# Patient Record
Sex: Female | Born: 1955 | Race: White | Hispanic: No | Marital: Single | Smoking: Never smoker
Health system: Southern US, Community
[De-identification: ages and names within clinical notes are randomized; demographics above are authoritative.]

## PROBLEM LIST (undated history)

## (undated) DIAGNOSIS — E119 Type 2 diabetes mellitus without complications: Secondary | ICD-10-CM

## (undated) DIAGNOSIS — E079 Disorder of thyroid, unspecified: Secondary | ICD-10-CM

## (undated) HISTORY — PX: TOTAL VAGINAL HYSTERECTOMY: SHX2548

## (undated) HISTORY — PX: ABDOMINAL HYSTERECTOMY: SHX81

---

## 1981-06-29 HISTORY — PX: REDUCTION MAMMAPLASTY: SUR839

## 2005-11-10 ENCOUNTER — Ambulatory Visit: Payer: Self-pay | Admitting: General Surgery

## 2006-03-11 ENCOUNTER — Inpatient Hospital Stay: Payer: Self-pay | Admitting: Internal Medicine

## 2010-02-25 ENCOUNTER — Ambulatory Visit: Payer: Self-pay | Admitting: Nephrology

## 2013-01-04 ENCOUNTER — Ambulatory Visit: Payer: Self-pay | Admitting: Internal Medicine

## 2014-04-20 ENCOUNTER — Ambulatory Visit: Payer: Self-pay | Admitting: Specialist

## 2014-09-24 ENCOUNTER — Ambulatory Visit: Payer: Self-pay | Admitting: Gastroenterology

## 2014-09-25 ENCOUNTER — Ambulatory Visit: Payer: Self-pay | Admitting: Specialist

## 2014-10-22 LAB — SURGICAL PATHOLOGY

## 2015-01-09 ENCOUNTER — Other Ambulatory Visit: Payer: Self-pay | Admitting: Internal Medicine

## 2015-01-09 DIAGNOSIS — Z1231 Encounter for screening mammogram for malignant neoplasm of breast: Secondary | ICD-10-CM

## 2015-01-14 ENCOUNTER — Ambulatory Visit: Payer: Self-pay | Attending: Internal Medicine

## 2015-01-28 HISTORY — PX: LAPAROSCOPIC GASTRIC SLEEVE RESECTION: SHX5895

## 2015-09-24 ENCOUNTER — Ambulatory Visit
Admission: RE | Admit: 2015-09-24 | Discharge: 2015-09-24 | Disposition: A | Discharge status: Home / Self Care | Payer: BC Managed Care – PPO | Source: Ambulatory Visit | Attending: Internal Medicine | Admitting: Internal Medicine

## 2015-09-24 DIAGNOSIS — Z1231 Encounter for screening mammogram for malignant neoplasm of breast: Secondary | ICD-10-CM

## 2016-01-11 ENCOUNTER — Encounter: Payer: Self-pay | Admitting: Emergency Medicine

## 2016-01-11 ENCOUNTER — Emergency Department
Admission: EM | Admit: 2016-01-11 | Discharge: 2016-01-11 | Disposition: A | Discharge status: Home / Self Care | Payer: BC Managed Care – PPO | Attending: Emergency Medicine | Admitting: Emergency Medicine

## 2016-01-11 ENCOUNTER — Emergency Department: Payer: BC Managed Care – PPO

## 2016-01-11 DIAGNOSIS — E119 Type 2 diabetes mellitus without complications: Secondary | ICD-10-CM | POA: Insufficient documentation

## 2016-01-11 DIAGNOSIS — R1031 Right lower quadrant pain: Secondary | ICD-10-CM | POA: Diagnosis present

## 2016-01-11 DIAGNOSIS — N200 Calculus of kidney: Secondary | ICD-10-CM | POA: Insufficient documentation

## 2016-01-11 HISTORY — DX: Type 2 diabetes mellitus without complications: E11.9

## 2016-01-11 HISTORY — DX: Disorder of thyroid, unspecified: E07.9

## 2016-01-11 LAB — COMPREHENSIVE METABOLIC PANEL
ALBUMIN: 4.6 g/dL (ref 3.5–5.0)
ALK PHOS: 125 U/L (ref 38–126)
ALT: 25 U/L (ref 14–54)
AST: 27 U/L (ref 15–41)
Anion gap: 15 (ref 5–15)
BUN: 14 mg/dL (ref 6–20)
CALCIUM: 9.5 mg/dL (ref 8.9–10.3)
CHLORIDE: 99 mmol/L — AB (ref 101–111)
CO2: 20 mmol/L — AB (ref 22–32)
CREATININE: 0.76 mg/dL (ref 0.44–1.00)
GFR calc non Af Amer: >60 mL/min (ref >60)
GLUCOSE: 295 mg/dL — AB (ref 65–99)
Potassium: 4 mmol/L (ref 3.5–5.1)
SODIUM: 134 mmol/L — AB (ref 135–145)
Total Bilirubin: 1 mg/dL (ref 0.3–1.2)
Total Protein: 7.6 g/dL (ref 6.5–8.1)

## 2016-01-11 LAB — CBC
HCT: 44.9 % (ref 35.0–47.0)
Hemoglobin: 15.4 g/dL (ref 12.0–16.0)
MCH: 29.4 pg (ref 26.0–34.0)
MCHC: 34.3 g/dL (ref 32.0–36.0)
MCV: 85.7 fL (ref 80.0–100.0)
Platelets: 310 10*3/uL (ref 150–440)
RBC: 5.24 MIL/uL — AB (ref 3.80–5.20)
RDW: 13.4 % (ref 11.5–14.5)
WBC: 13 10*3/uL — ABNORMAL HIGH (ref 3.6–11.0)

## 2016-01-11 LAB — URINALYSIS COMPLETE WITH MICROSCOPIC (ARMC ONLY)
BILIRUBIN URINE: NEGATIVE
Glucose, UA: >500 mg/dL — AB
Nitrite: NEGATIVE
PH: 5 (ref 5.0–8.0)
Protein, ur: NEGATIVE mg/dL
Specific Gravity, Urine: 1.013 (ref 1.005–1.030)

## 2016-01-11 LAB — LIPASE, BLOOD: LIPASE: 32 U/L (ref 11–51)

## 2016-01-11 MED ORDER — ONDANSETRON 4 MG PO TBDP
4.0000 mg | ORAL_TABLET | Freq: Once | ORAL | Status: AC
  Administered 2016-01-11: 4 mg via ORAL
  Filled 2016-01-11: qty 1

## 2016-01-11 MED ORDER — MORPHINE SULFATE 15 MG PO TABS: 15.0000 mg | ORAL_TABLET | Freq: Four times a day (QID) | ORAL | Status: DC | PRN

## 2016-01-11 MED ORDER — ONDANSETRON 4 MG PO TBDP: 4.0000 mg | ORAL_TABLET | Freq: Four times a day (QID) | ORAL | Status: DC | PRN

## 2016-01-11 MED ORDER — IBUPROFEN 600 MG PO TABS
600.0000 mg | ORAL_TABLET | ORAL | Status: AC
  Administered 2016-01-11: 600 mg via ORAL
  Filled 2016-01-11: qty 1

## 2016-01-11 NOTE — Discharge Instructions (Signed)
You have been seen in the Emergency Department (ED) today for pain that we believe based on your workup, is caused by kidney stones.  As we have discussed, please drink plenty of fluids.  Please make a follow up appointment with the physician(s) listed elsewhere in this documentation.  You may take pain medication as needed but ONLY as prescribed.  Please also take your prescribed Flomax daily.  We also recommend that you take over-the-counter ibuprofen regularly according to label instructions over the next 5 days.  Take it with meals to minimize stomach discomfort.  Please see your doctor as soon as possible as stones may take 1-3 weeks to pass and you may require additional care or medications.  Do not drink alcohol, drive or participate in any other potentially dangerous activities while taking opiate pain medication (MORPHINE) as it may make you sleepy. Do not take this medication with any other sedating medications, either prescription or over-the-counter.    This medication is an opiate (or narcotic) pain medication and can be habit forming.  Use it as little as possible to achieve adequate pain control.  Do not use or use it with extreme caution if you have a history of opiate abuse or dependence.  If you are on a pain contract with your primary care doctor or a pain specialist, be sure to let them know you were prescribed this medication today from the [facility] Emergency Department.  This medication is intended for your use only - do not give any to anyone else and keep it in a secure place where nobody else, especially children, have access to it.  It will also cause or worsen constipation, so you may want to consider taking an over-the-counter stool softener while you are taking this medication.  Return to the Emergency Department (ED) or call your doctor if you have any worsening pain, fever, painful urination, are unable to urinate, or develop other symptoms that concern  you.   Kidney Stones Kidney stones (urolithiasis) are deposits that form inside your kidneys. The intense pain is caused by the stone moving through the urinary tract. When the stone moves, the ureter goes into spasm around the stone. The stone is usually passed in the urine.  CAUSES   A disorder that makes certain neck glands produce too much parathyroid hormone (primary hyperparathyroidism).  A buildup of uric acid crystals, similar to gout in your joints.  Narrowing (stricture) of the ureter.  A kidney obstruction present at birth (congenital obstruction).  Previous surgery on the kidney or ureters.  Numerous kidney infections. SYMPTOMS   Feeling sick to your stomach (nauseous).  Throwing up (vomiting).  Blood in the urine (hematuria).  Pain that usually spreads (radiates) to the groin.  Frequency or urgency of urination. DIAGNOSIS   Taking a history and physical exam.  Blood or urine tests.  CT scan.  Occasionally, an examination of the inside of the urinary bladder (cystoscopy) is performed. TREATMENT   Observation.  Increasing your fluid intake.  Extracorporeal shock wave lithotripsy--This is a noninvasive procedure that uses shock waves to break up kidney stones.  Surgery may be needed if you have severe pain or persistent obstruction. There are various surgical procedures. Most of the procedures are performed with the use of small instruments. Only small incisions are needed to accommodate these instruments, so recovery time is minimized. The size, location, and chemical composition are all important variables that will determine the proper choice of action for you. Talk to your  health care provider to better understand your situation so that you will minimize the risk of injury to yourself and your kidney.  HOME CARE INSTRUCTIONS   Drink enough water and fluids to keep your urine clear or pale yellow. This will help you to pass the stone or stone  fragments.  Strain all urine through the provided strainer. Keep all particulate matter and stones for your health care provider to see. The stone causing the pain may be as small as a grain of salt. It is very important to use the strainer each and every time you pass your urine. The collection of your stone will allow your health care provider to analyze it and verify that a stone has actually passed. The stone analysis will often identify what you can do to reduce the incidence of recurrences.  Only take over-the-counter or prescription medicines for pain, discomfort, or fever as directed by your health care provider.  Keep all follow-up visits as told by your health care provider. This is important.  Get follow-up X-rays if required. The absence of pain does not always mean that the stone has passed. It may have only stopped moving. If the urine remains completely obstructed, it can cause loss of kidney function or even complete destruction of the kidney. It is your responsibility to make sure X-rays and follow-ups are completed. Ultrasounds of the kidney can show blockages and the status of the kidney. Ultrasounds are not associated with any radiation and can be performed easily in a matter of minutes.  Make changes to your daily diet as told by your health care provider. You may be told to:  Limit the amount of salt that you eat.  Eat 5 or more servings of fruits and vegetables each day.  Limit the amount of meat, poultry, fish, and eggs that you eat.  Collect a 24-hour urine sample as told by your health care provider.You may need to collect another urine sample every 6-12 months. SEEK MEDICAL CARE IF:  You experience pain that is progressive and unresponsive to any pain medicine you have been prescribed. SEEK IMMEDIATE MEDICAL CARE IF:   Pain cannot be controlled with the prescribed medicine.  You have a fever or shaking chills.  The severity or intensity of pain increases over  18 hours and is not relieved by pain medicine.  You develop a new onset of abdominal pain.  You feel faint or pass out.  You are unable to urinate.   This information is not intended to replace advice given to you by your health care provider. Make sure you discuss any questions you have with your health care provider.   Document Released: 06/15/2005 Document Revised: 03/06/2015 Document Reviewed: 11/16/2012 Elsevier Interactive Patient Education [employer]2016 [employer] Inc.

## 2016-01-11 NOTE — ED Notes (Signed)
Patient refused IV at this time. Patient is sitting on side of stretcher for comfort.

## 2016-01-11 NOTE — ED Provider Notes (Signed)
[facility] Emergency Department Provider Note  ____________________________________________  Time seen: Approximately 6:04 PM  I have reviewed the triage vital signs and the nursing notes.   HISTORY  Chief Complaint Abdominal Pain    HPI Brittany Wood is a 60 y.o. female who reports she is having severe pain in her right lower abdomen and right flank.  Since about Wednesday she's been experiencing a discomfort with urination, and she also notes she is been having some slight amount of blood in her urine. Today she woke up and she had fairly severe pain described as 10 on a 10 now down to a 7 over the right flank and radiating towards the right lower back.  No vaginal discomfort or bleeding. She reports that she feels a little better now, but did throw up twice due to the severity of pain and still feels quite nauseated.  She is on ciprofloxacin which her primary care put her on because of a history of prior UTIs, but she denies any fevers chills abnormal urine odor and only experiencing a little bit of a strange feeling with urination for the last several days.   Past Medical History  Diagnosis Date  . Diabetes mellitus without complication (HCC)   . Thyroid disease     There are no active problems to display for this patient.   Past Surgical History  Procedure Laterality Date  . Reduction mammaplasty Bilateral 1983    scars around nipples and under breast from the surgery    Current Outpatient Rx  Name  Route  Sig  Dispense  Refill  . morphine (MSIR) 15 MG tablet   Oral   Take 1 tablet (15 mg total) by mouth every 6 (six) hours as needed for severe pain.   20 tablet   0   . ondansetron (ZOFRAN ODT) 4 MG disintegrating tablet   Oral   Take 1 tablet (4 mg total) by mouth every 6 (six) hours as needed for nausea or vomiting.   20 tablet   0     Allergies Review of patient's allergies indicates no known allergies.  Family History   Problem Relation Age of Onset  . Ovarian cancer Mother     Social History Social History  Substance Use Topics  . Smoking status: Never Smoker   . Smokeless tobacco: None  . Alcohol Use: No    Review of Systems Constitutional: No fever/chills Eyes: No visual changes. ENT: No sore throat. Cardiovascular: Denies chest pain. Respiratory: Denies shortness of breath. Gastrointestinal:   No diarrhea.  No constipation. Genitourinary: See history of present illness  Musculoskeletal: Negative for back pain. Skin: Negative for rash. Neurological: Negative for headaches, focal weakness or numbness.  10-point ROS otherwise negative.  ____________________________________________   PHYSICAL EXAM:  VITAL SIGNS: ED Triage Vitals  Enc Vitals Group     BP 01/11/16 1311 151/84 mmHg     Pulse Rate 01/11/16 1311 79     Resp 01/11/16 1311 20     Temp 01/11/16 1311 97.8 F (36.6 C)     Temp Source 01/11/16 1311 Oral     SpO2 01/11/16 1311 98 %     Weight 01/11/16 1311 210 lb (95.255 kg)     Height 01/11/16 1311 5\' 7"  (1.702 m)     Head Cir --      Peak Flow --      Pain Score 01/11/16 1312 7     Pain Loc --  Pain Edu? --      Excl. in GC? --    Constitutional: Alert and oriented. Well appearing and in no acute distressThough she is sitting up on the side of the bed, and appears to have discomfort along the right flank. Eyes: Conjunctivae are normal. PERRL. EOMI. Head: Atraumatic. Nose: No congestion/rhinnorhea. Mouth/Throat: Mucous membranes are moist.  Oropharynx non-erythematous. Neck: No stridor.   Cardiovascular: Normal rate, regular rhythm. Grossly normal heart sounds.  Good peripheral circulation. Respiratory: Normal respiratory effort.  No retractions. Lungs CTAB. Gastrointestinal: Soft and nontender. Right CVA tenderness noted. Negative Murphy No distention. No abdominal bruits.  Musculoskeletal: No lower extremity tenderness nor edema.  Neurologic:  Normal speech  and language. No gross focal neurologic deficits are appreciated. Skin:  Skin is warm, dry and intact. No rash noted. Psychiatric: Mood and affect are normal. Speech and behavior are normal.  ____________________________________________   LABS (all labs ordered are listed, but only abnormal results are displayed)  Labs Reviewed  COMPREHENSIVE METABOLIC PANEL - Abnormal; Notable for the following:    Sodium 134 (*)    Chloride 99 (*)    CO2 20 (*)    Glucose, Bld 295 (*)    All other components within normal limits  CBC - Abnormal; Notable for the following:    WBC 13.0 (*)    RBC 5.24 (*)    All other components within normal limits  URINALYSIS COMPLETEWITH MICROSCOPIC ([facility] ONLY) - Abnormal; Notable for the following:    Color, Urine YELLOW (*)    APPearance CLEAR (*)    Glucose, UA >500 (*)    Ketones, ur 1+ (*)    Hgb urine dipstick 3+ (*)    Leukocytes, UA TRACE (*)    Bacteria, UA RARE (*)    Squamous Epithelial / LPF 0-5 (*)    All other components within normal limits  LIPASE, BLOOD   ____________________________________________  EKG   ____________________________________________  RADIOLOGY   CT Renal Stone Study (Final result) Result time: 01/11/16 17:02:12   Final result by Rad Results In Interface (01/11/16 17:02:12)   Narrative:   CLINICAL DATA: 60 year old female with acute right flank and abdominal pain today with nausea and vomiting.  EXAM: CT ABDOMEN AND PELVIS WITHOUT CONTRAST  TECHNIQUE: Multidetector CT imaging of the abdomen and pelvis was performed following the standard protocol without IV contrast.  COMPARISON: 11/11/2015 CT and 09/25/2014 MRCP.  FINDINGS: Please note that parenchymal abnormalities may be missed without intravenous contrast.  Lower chest: No acute abnormality  Hepatobiliary: Hepatic steatosis identified without focal hepatic abnormality. Cholelithiasis identified without CT evidence of acute cholecystitis.  There is no evidence of biliary dilatation.  Pancreas: Unremarkable  Spleen: Unremarkable  Adrenals/Urinary Tract: A 2 mm right UVJ calculus causes mild right hydroureteronephrosis. A 2 mm scratch de punctate nonobstructing bilateral renal calculi are identified. The adrenal glands are unremarkable.  Stomach/Bowel: A small hiatal hernia is noted. There is no evidence of bowel obstruction.  Vascular/Lymphatic: Abdominal aortic atherosclerotic calcifications noted without aneurysm. No enlarged lymph nodes identified.  Reproductive: The patient is status post hysterectomy. No adnexal masses identified.  Other: No free fluid, focal collection or pneumoperitoneum.  Musculoskeletal: No acute or suspicious abnormality identified. Moderate degenerative disc disease at L5-S1 noted.  IMPRESSION: 2 mm right UVJ calculus causing mild right hydroureteronephrosis.  Punctate nonobstructing bilateral renal calculi.  Hepatic steatosis and cholelithiasis.  Small hiatal hernia.  Abdominal aortic atherosclerosis.   Electronically Signed By: Harmon Pier M.D. On: 01/11/2016 17:02  ____________________________________________   PROCEDURES  Procedure(s) performed: None  Critical Care performed: No  ____________________________________________   INITIAL IMPRESSION / ASSESSMENT AND PLAN / ED COURSE  Pertinent labs & imaging results that were available during my care of the patient were reviewed by me and considered in my medical decision making (see chart for details).  Appears consistent with right-sided kidney stone. Afebrile, slight leukocytosis but suspect likely or inflammatory or due to pain. No evidence of infection, currently on Cipro.  Patient reports she tolerates morphine orally after previous surgery, but she cannot tolerate any Percocet or Vicodin due to severe nausea. I will give her a prescription for morphine for pain control, patient very agreeable to  safe use instructions which I personally discussed with her. I will prescribe the patient a narcotic pain medicine due to their condition which I anticipate will cause at least moderate pain short term. I discussed with the patient safe use of narcotic pain medicines, and that they are not to drive, work in dangerous areas, or ever take more than prescribed (no more than 1 pill every 6 hours). We discussed that this is the type of medication that can be  overdosed on and the risks of this type of medicine. Patient is very agreeable to only use as prescribed and to never use more than prescribed.  Return precautions and treatment recommendations and follow-up discussed with the patient who is agreeable with the plan. Patient's dad is picking her up.  ____________________________________________   FINAL CLINICAL IMPRESSION(S) / ED DIAGNOSES  Final diagnoses:  Right kidney stone      Sharyn Creamer, MD 01/11/16 1825

## 2016-01-11 NOTE — ED Notes (Signed)
Presents with Right sided abd pain/flank pain since about 6 am   Positive nv/

## 2016-02-11 IMAGING — MR MR ABDOMEN WO/W CM MRCP
19 of 22 series · 41 of 48 positions shown · IV contrast (20ML MULTIHANCE)
Comparison: Ultrasound on 04/20/2014

CLINICAL DATA: Cholelithiasis. Elevated alkaline phosphatase level.
Generalized abdominal pain.

EXAM:
MRI ABDOMEN WITHOUT AND WITH CONTRAST (INCLUDING MRCP)
TECHNIQUE: Multiplanar multisequence MR imaging of the abdomen was performed
both before and after the administration of intravenous contrast.
Heavily T2-weighted images of the biliary and pancreatic ducts were
obtained, and three-dimensional MRCP images were rendered by post
processing.
CONTRAST:  20 mL MultiHance

[Series 4: bSSFP · coronal · 6.0mm · 0.88mm/px · 1 of 37 slices shown]
[im 1/37]
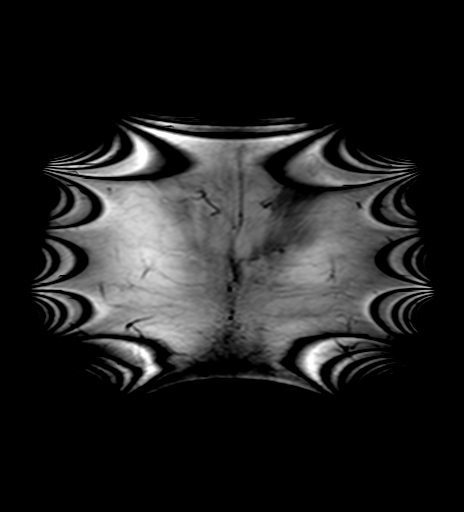

[Series 5: T2 fat-sat · axial · 6.0mm · 1.76mm/px · 1 of 39 slices shown]
[im 1/39]
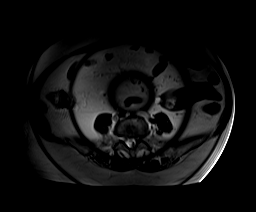

[Series 7: DWI · axial · 6.0mm · 2.34mm/px · z∈[-133,+141]mm · 3 of 117 slices shown]
[im 1/117]
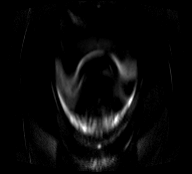
[im 59/117]
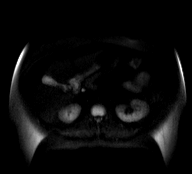
[im 117/117]
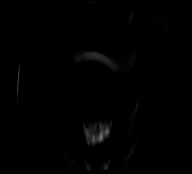

[Series 8: ax dwi_adc · axial · 6.0mm · 2.34mm/px · 1 of 39 slices shown]
[im 1/39]
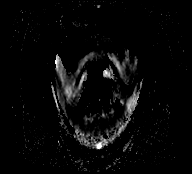

[Series 10: cor thins · coronal · 3.0mm · 1.17mm/px · 1 of 17 slices shown]
[im 1/17]
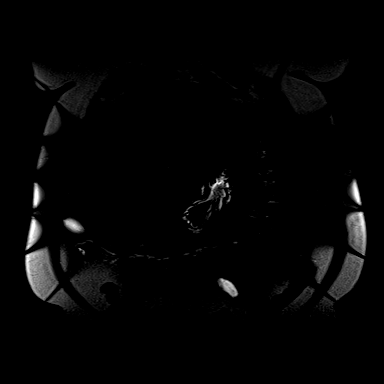

[Series 11: MRCP · coronal · 5.5mm · 1.17mm/px · 1 of 17 slices shown]
[im 1/17]
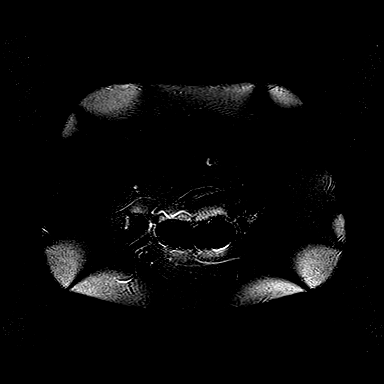

[Series 13: T2 · coronal · 2.0mm · 1.17mm/px · 3 of 80 slices shown]
[im 1/80]
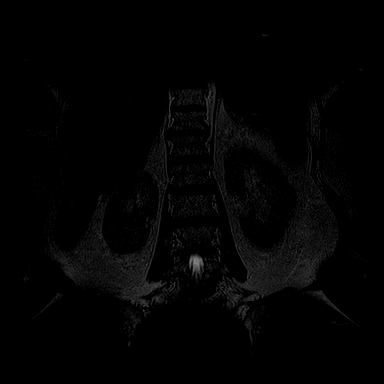
[im 40/80]
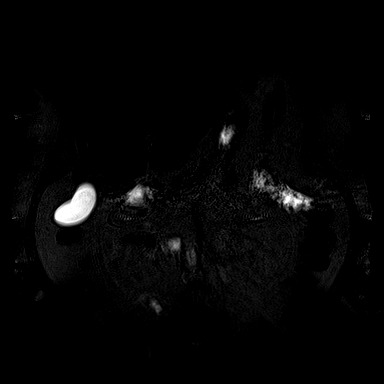
[im 80/80]
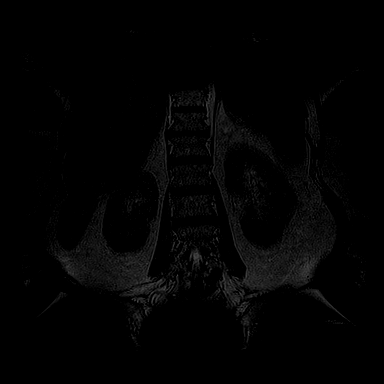

[Series 15: axial ssfse / · axial · 6.0mm · 1.41mm/px · 1 of 39 slices shown]
[im 1/39]
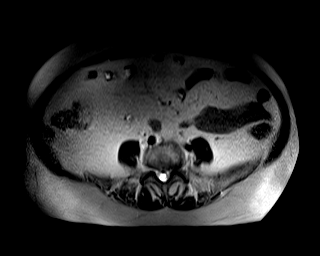

[Series 16: axial dynamic pre · axial · non-contrast · 4.0mm · 1.41mm/px · z∈[-138,+146]mm · 3 of 72 slices shown]
[im 1/72]
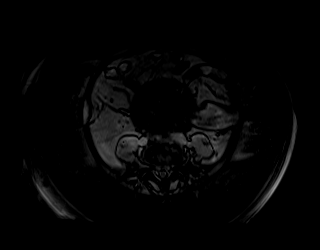
[im 36/72]
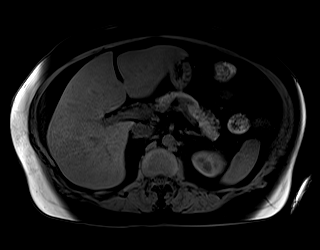
[im 72/72]
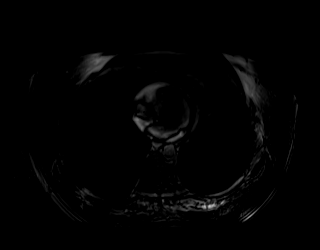

[Series 17: axial dynamic post · axial · 4.0mm · 1.41mm/px · z∈[-138,+146]mm · 3 of 72 slices shown (1 of 3)]
[im 1/72]
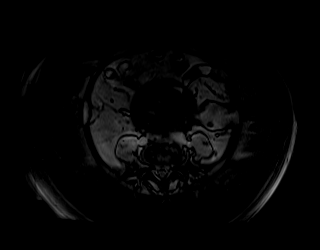
[im 36/72]
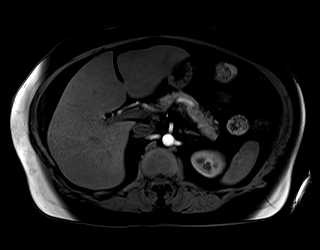
[im 72/72]
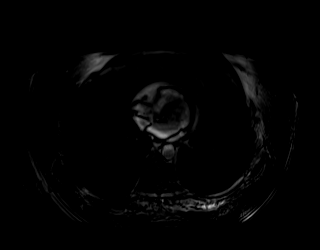

[Series 18: axial dynamic post · axial · 4.0mm · 1.41mm/px · z∈[-138,+146]mm · 3 of 72 slices shown (2 of 3)]
[im 1/72]
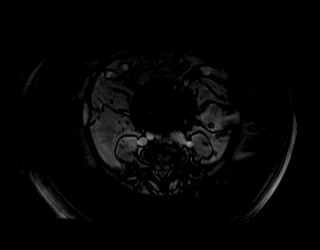
[im 36/72]
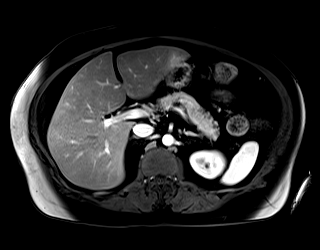
[im 72/72]
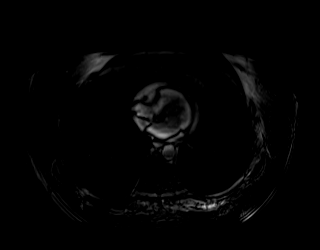

[Series 19: axial dynamic post · axial · 4.0mm · 1.41mm/px · z∈[-138,+146]mm · 3 of 72 slices shown (3 of 3)]
[im 1/72]
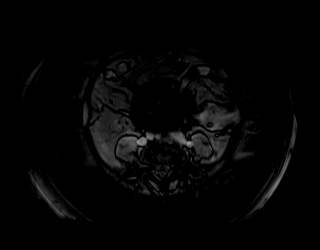
[im 36/72]
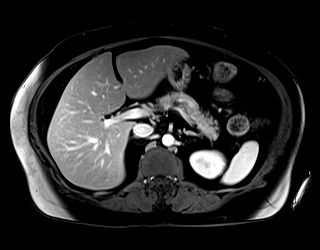
[im 72/72]
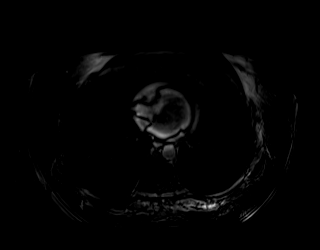

[Series 21: axial dynamic delayed · axial · 4.0mm · 1.41mm/px · z∈[-138,+146]mm · 3 of 72 slices shown]
[im 1/72]
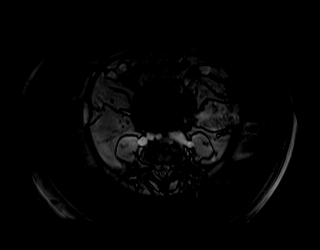
[im 36/72]
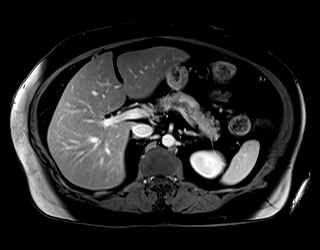
[im 72/72]
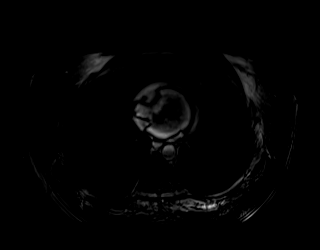

[Series 100: axial out of · axial · 6.0mm · 0.88mm/px · 1 of 39 slices shown]
[im 1/39]
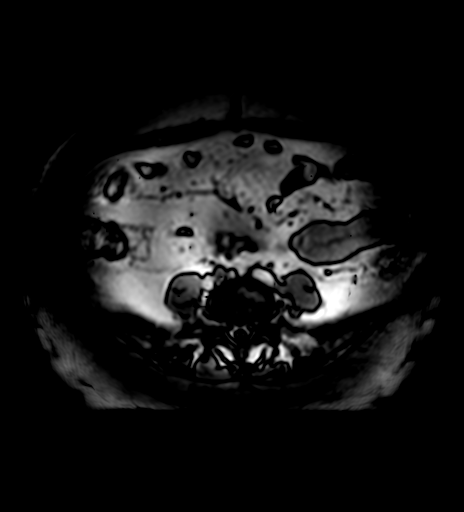

[Series 101: axial in phase · axial · 6.0mm · 0.88mm/px · 1 of 39 slices shown]
[im 1/39]
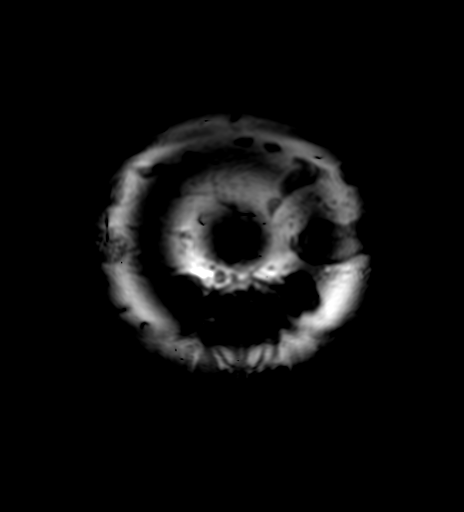

[Series 104: axial sub immediate · axial · 4.0mm · 1.41mm/px · z∈[-138,+146]mm · 3 of 72 slices shown]
[im 1/72]
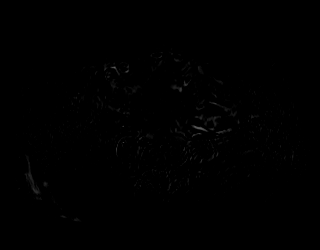
[im 36/72]
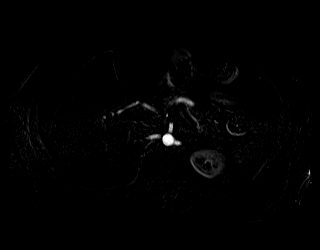
[im 72/72]
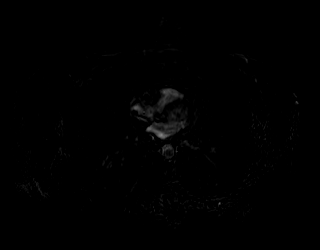

[Series 105: axial sub 45 · axial · 4.0mm · 1.41mm/px · z∈[-138,+146]mm · 3 of 72 slices shown]
[im 1/72]
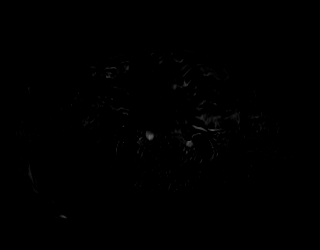
[im 36/72]
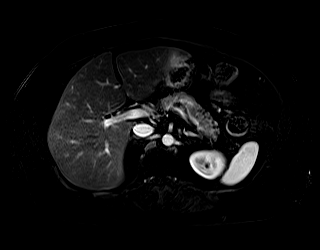
[im 72/72]
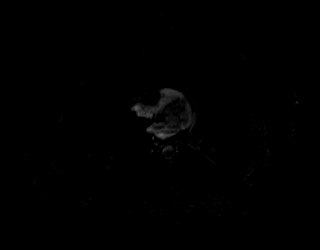

[Series 106: axial sub 90 · axial · 4.0mm · 1.41mm/px · z∈[-138,+146]mm · 3 of 72 slices shown]
[im 1/72]
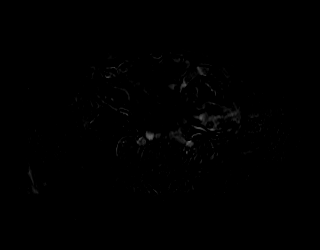
[im 36/72]
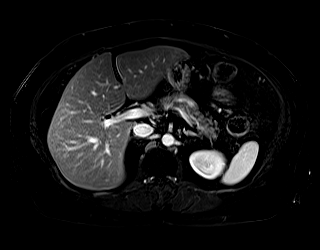
[im 72/72]
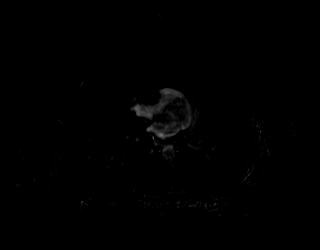

[Series 107: axial sub delayed · axial · 4.0mm · 1.41mm/px · z∈[-138,+146]mm · 3 of 72 slices shown]
[im 1/72]
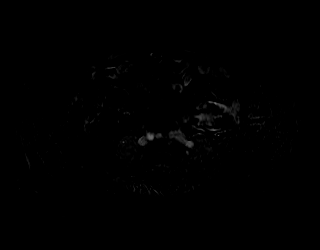
[im 36/72]
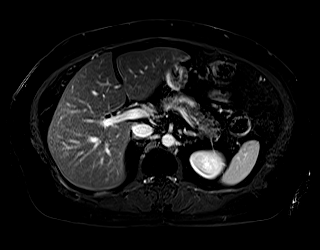
[im 72/72]
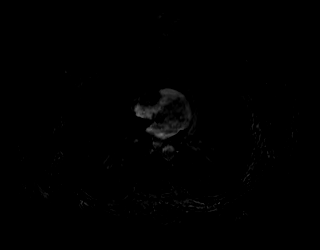

[41 of 48 positions shown; findings below may reference images not displayed]

FINDINGS: Lower chest:  Unremarkable.

Hepatobiliary: No hypervascular or hypovascular masses identified.
Moderate to severe hepatic steatosis is demonstrated on chemical
shift imaging.

Cholelithiasis is demonstrated, without evidence of acute
cholecystitis. No evidence of biliary ductal dilatation with common
bile duct measuring 4 mm. No definite calculi seen within the common
bile duct.

Pancreas: No masses, inflammatory changes, or fluid collections
demonstrated.

Spleen:  Within normal limits in size and appearance.

Adrenal Glands:  No masses identified.

Kidneys: No renal masses identified. No evidence of hydronephrosis.

Stomach/Bowel/Peritoneum: No evidence of wall thickening, mass, or
obstruction involving visualized abdominal bowel.

Vascular/Lymphatic: No pathologically enlarged lymph nodes
identified. No other significant abnormality noted.

Other:  None.

Musculoskeletal:  No suspicious bone lesions identified.
IMPRESSION: Cholelithiasis. No radiographic evidence of cholecystitis, biliary
dilatation, or choledocholithiasis.

Moderate to severe hepatic steatosis.

## 2016-09-15 ENCOUNTER — Other Ambulatory Visit: Payer: Self-pay | Admitting: Internal Medicine

## 2016-09-15 DIAGNOSIS — Z1231 Encounter for screening mammogram for malignant neoplasm of breast: Secondary | ICD-10-CM

## 2016-09-29 ENCOUNTER — Ambulatory Visit
Admission: RE | Admit: 2016-09-29 | Discharge: 2016-09-29 | Disposition: A | Discharge status: Home / Self Care | Payer: BC Managed Care – PPO | Source: Ambulatory Visit | Attending: Internal Medicine | Admitting: Internal Medicine

## 2016-09-29 DIAGNOSIS — Z1231 Encounter for screening mammogram for malignant neoplasm of breast: Secondary | ICD-10-CM | POA: Diagnosis present

## 2016-12-19 ENCOUNTER — Emergency Department
Admission: EM | Admit: 2016-12-19 | Discharge: 2016-12-19 | Disposition: A | Discharge status: Home / Self Care | Payer: BC Managed Care – PPO | Attending: Emergency Medicine | Admitting: Emergency Medicine

## 2016-12-19 ENCOUNTER — Encounter: Payer: Self-pay | Admitting: Emergency Medicine

## 2016-12-19 ENCOUNTER — Emergency Department: Payer: BC Managed Care – PPO

## 2016-12-19 DIAGNOSIS — R1032 Left lower quadrant pain: Secondary | ICD-10-CM | POA: Insufficient documentation

## 2016-12-19 DIAGNOSIS — Z79899 Other long term (current) drug therapy: Secondary | ICD-10-CM | POA: Insufficient documentation

## 2016-12-19 DIAGNOSIS — E119 Type 2 diabetes mellitus without complications: Secondary | ICD-10-CM | POA: Insufficient documentation

## 2016-12-19 DIAGNOSIS — K5900 Constipation, unspecified: Secondary | ICD-10-CM | POA: Diagnosis not present

## 2016-12-19 DIAGNOSIS — R109 Unspecified abdominal pain: Secondary | ICD-10-CM

## 2016-12-19 LAB — BASIC METABOLIC PANEL
ANION GAP: 8 (ref 5–15)
BUN: 13 mg/dL (ref 6–20)
CHLORIDE: 99 mmol/L — AB (ref 101–111)
CO2: 28 mmol/L (ref 22–32)
Calcium: 9.1 mg/dL (ref 8.9–10.3)
Creatinine, Ser: 0.44 mg/dL (ref 0.44–1.00)
GFR calc Af Amer: >60 mL/min (ref >60)
GLUCOSE: 213 mg/dL — AB (ref 65–99)
POTASSIUM: 3.7 mmol/L (ref 3.5–5.1)
Sodium: 135 mmol/L (ref 135–145)

## 2016-12-19 LAB — URINALYSIS, COMPLETE (UACMP) WITH MICROSCOPIC
Bacteria, UA: NONE SEEN
Bilirubin Urine: NEGATIVE
Glucose, UA: >=500 mg/dL — AB
Ketones, ur: NEGATIVE mg/dL
Leukocytes, UA: NEGATIVE
Nitrite: NEGATIVE
PH: 5 (ref 5.0–8.0)
Protein, ur: NEGATIVE mg/dL
SPECIFIC GRAVITY, URINE: 1.031 — AB (ref 1.005–1.030)

## 2016-12-19 LAB — CBC
HEMATOCRIT: 39.1 % (ref 35.0–47.0)
HEMOGLOBIN: 13.4 g/dL (ref 12.0–16.0)
MCH: 28.8 pg (ref 26.0–34.0)
MCHC: 34.4 g/dL (ref 32.0–36.0)
MCV: 83.8 fL (ref 80.0–100.0)
PLATELETS: 280 10*3/uL (ref 150–440)
RBC: 4.66 MIL/uL (ref 3.80–5.20)
RDW: 13.3 % (ref 11.5–14.5)
WBC: 9.5 10*3/uL (ref 3.6–11.0)

## 2016-12-19 MED ORDER — MORPHINE SULFATE (PF) 4 MG/ML IV SOLN
4.0000 mg | Freq: Once | INTRAVENOUS | Status: AC
Start: 2016-12-19 — End: 2016-12-19
  Administered 2016-12-19: 4 mg via INTRAVENOUS
  Filled 2016-12-19: qty 1

## 2016-12-19 MED ORDER — ONDANSETRON HCL 4 MG/2ML IJ SOLN
4.0000 mg | Freq: Once | INTRAMUSCULAR | Status: AC
Start: 2016-12-19 — End: 2016-12-19
  Administered 2016-12-19: 4 mg via INTRAVENOUS
  Filled 2016-12-19: qty 2

## 2016-12-19 MED ORDER — IOPAMIDOL (ISOVUE-370) INJECTION 76%
100.0000 mL | Freq: Once | INTRAVENOUS | Status: AC | PRN
  Administered 2016-12-19: 100 mL via INTRAVENOUS

## 2016-12-19 NOTE — Discharge Instructions (Addendum)
Constipation: Take colace twice a day everyday. Take senna once a day at bedtime. Take daily probiotics. Drink plenty of fluids and eat a diet rich in fiber. If you go more than 3 days without a bowel movement, take 1 cap full of Miralax in the morning and one in the evening up to 5 days.  ° °

## 2016-12-19 NOTE — ED Provider Notes (Signed)
[facility] Emergency Department Provider Note  ____________________________________________  Time seen: Approximately 5:21 PM  I have reviewed the triage vital signs and the nursing notes.   HISTORY  Chief Complaint Flank Pain   HPI Brittany Wood is a 61 y.o. female with a history of diabetes, hypothyroidism, kidney stones and presents for evaluation of left flank pain. Patient endorses 2-3 daysof sharp, intermittent, severe at times left flank pain radiating to the left lower quadrant. She has had nausea but no vomiting. She has had hematuria but no dysuria. She also been constipated for the same number of days which is atypical for her. Yesterday she noted her abdomen was a little distended on the left side however that has resolved. She is passing flatus. No prior history of SBO. She has had a sleeve gastrectomy and hysterectomy in the past. She denies diarrhea.  Today she took Colace and was straining in the bathroom unsuccessfully however had blood in the toilet. No prior history of rectal bleeding. She is not on any blood thinners. Currently her pain is 6 out of 10.  Past Medical History:  Diagnosis Date  . Diabetes mellitus without complication (HCC)   . Thyroid disease     There are no active problems to display for this patient.   Past Surgical History:  Procedure Laterality Date  . ABDOMINAL HYSTERECTOMY    . REDUCTION MAMMAPLASTY Bilateral 1983   scars around nipples and under breast from the surgery    Prior to Admission medications   Medication Sig Start Date End Date Taking? Authorizing Provider  cholecalciferol (VITAMIN D) 1000 units tablet Take 1,000 Units by mouth daily.   Yes [provider]  escitalopram (LEXAPRO) 20 MG tablet Take 20 mg by mouth daily. 12/09/16  Yes [provider]  levothyroxine (SYNTHROID, LEVOTHROID) 200 MCG tablet Take 200 mcg by mouth daily. 12/06/16  Yes [provider]    LORazepam (ATIVAN) 0.5 MG tablet Take 0.5 mg by mouth 3 (three) times daily as needed. 12/09/16  Yes [provider]  morphine (MSIR) 15 MG tablet Take 1 tablet (15 mg total) by mouth every 6 (six) hours as needed for severe pain. Patient not taking: Reported on 12/19/2016 01/11/16   Sharyn CreamerQuale, Mark, MD  ondansetron (ZOFRAN ODT) 4 MG disintegrating tablet Take 1 tablet (4 mg total) by mouth every 6 (six) hours as needed for nausea or vomiting. Patient not taking: Reported on 12/19/2016 01/11/16   Sharyn CreamerQuale, Mark, MD    Allergies Codeine  Family History  Problem Relation Age of Onset  . Ovarian cancer Mother   . Breast cancer Maternal Aunt        mat great aunt    Social History Social History  Substance Use Topics  . Smoking status: Never Smoker  . Smokeless tobacco: Never Used  . Alcohol use Yes    Review of Systems  Constitutional: Negative for fever. Eyes: Negative for visual changes. ENT: Negative for sore throat. Neck: No neck pain  Cardiovascular: Negative for chest pain. Respiratory: Negative for shortness of breath. Gastrointestinal: Negative for abdominal pain, vomiting, and diarrhea. + N, constipation and rectal bleeding Genitourinary: Negative for dysuria. + hematuria and L flank pain Musculoskeletal: Negative for back pain. Skin: Negative for rash. Neurological: Negative for headaches, weakness or numbness. Psych: No SI or HI  ____________________________________________   PHYSICAL EXAM:  VITAL SIGNS: ED Triage Vitals [12/19/16 1702]  Enc Vitals Group     BP (!) 123/56  Pulse Rate (!) 104     Resp 18     Temp 99 F (37.2 C)     Temp Source Oral     SpO2 98 %     Weight 198 lb (89.8 kg)     Height 5\' 9"  (1.753 m)     Head Circumference      Peak Flow      Pain Score 5     Pain Loc      Pain Edu?      Excl. in GC?     Constitutional: Alert and oriented. Well appearing and in no apparent distress. HEENT:      Head: Normocephalic and  atraumatic.         Eyes: Conjunctivae are normal. Sclera is non-icteric.       Mouth/Throat: Mucous membranes are moist.       Neck: Supple with no signs of meningismus. Cardiovascular: Tachycardic with regular rhythm. No murmurs, gallops, or rubs. 2+ symmetrical distal pulses are present in all extremities. No JVD. Respiratory: Normal respiratory effort. Lungs are clear to auscultation bilaterally. No wheezes, crackles, or rhonchi.  Gastrointestinal: Soft, mild left sided tpp, and non distended with positive bowel sounds. No rebound or guarding. Genitourinary: Mild L CVA tenderness. Rectal exam showing a anal fissure with signs of prior bleeding but hemostatic during exam. Musculoskeletal: Nontender with normal range of motion in all extremities. No edema, cyanosis, or erythema of extremities. Neurologic: Normal speech and language. Face is symmetric. Moving all extremities. No gross focal neurologic deficits are appreciated. Skin: Skin is warm, dry and intact. No rash noted. Psychiatric: Mood and affect are normal. Speech and behavior are normal.  ____________________________________________   LABS (all labs ordered are listed, but only abnormal results are displayed)  Labs Reviewed  URINALYSIS, COMPLETE (UACMP) WITH MICROSCOPIC - Abnormal; Notable for the following:       Result Value   Color, Urine YELLOW (*)    APPearance CLEAR (*)    Specific Gravity, Urine 1.031 (*)    Glucose, UA >=500 (*)    Hgb urine dipstick SMALL (*)    Squamous Epithelial / LPF 0-5 (*)    All other components within normal limits  BASIC METABOLIC PANEL - Abnormal; Notable for the following:    Chloride 99 (*)    Glucose, Bld 213 (*)    All other components within normal limits  URINE CULTURE  CBC   ____________________________________________  EKG  none ____________________________________________  RADIOLOGY  CTA c/a/p: 1. No acute findings within the chest, abdomen or pelvis. No renal or  ureteral stone. 2. Cholelithiasis without evidence of acute cholecystitis. 3. Moderate amount of stool throughout the nondistended colon and rectal vault (constipation? ). No bowel obstruction or evidence of bowel wall inflammation. 4. Small hiatal hernia. 5. Aortic atherosclerosis. ____________________________________________   PROCEDURES  Procedure(s) performed: None Procedures Critical Care performed:  None ____________________________________________   INITIAL IMPRESSION / ASSESSMENT AND PLAN / ED COURSE  61 y.o. female with a history of diabetes, hypothyroidism, kidney stones and presents for evaluation of left flank pain x 3 days, constipation, hematuria, one episode of rectal bleeding.  #flank pain and hematuria: Patient with history of kidney stones in the past. We'll sent for CT renal to rule out a stone. We'll also check urinalysis to rule out superimposed urinary tract infection.  #rectal bleeding: on exam patient with anal fissure which is now hemostatic.  Clinical Course as of Dec 20 2027  Sat Dec 19, 2016  1937 CT abdomen and pelvis without an acute finding, UA negative for UTI. Patient's pain intensified in the emergency room and she looked pretty uncomfortable. CTA will be done to rule out PE versus dissection.  [CV]  2027 CTA with no evidence of PE, dissection, or any other acute findings. Patient does have large amount of stool burden which could be causing her symptoms especially since she's been constipated for the last 3 days. We'll give an enema and reassess. Care transferred to Dr. Scotty Court  [CV]    Clinical Course User Index [CV] Don Perking [place], MD    Pertinent labs & imaging results that were available during my care of the patient were reviewed by me and considered in my medical decision making (see chart for details).    ____________________________________________   FINAL CLINICAL IMPRESSION(S) / ED DIAGNOSES  Final diagnoses:  Left flank  pain  Constipation, unspecified constipation type      NEW MEDICATIONS STARTED DURING THIS VISIT:  New Prescriptions   No medications on file     Note:  This document was prepared using Dragon voice recognition software and may include unintentional dictation errors.    Nita Sickle, MD 12/19/16 2029

## 2016-12-19 NOTE — ED Notes (Signed)
Patient transported to CT 

## 2016-12-19 NOTE — ED Triage Notes (Signed)
Pt arrived via POV from home states she has been having left flank pain since Friday, states pain is similar to kidney stone she had last year. Decreased urine output with some discomfort.  Pain radiates to left side of abdomen down to lower abdomen.  Pt also c/o constipation.

## 2016-12-19 NOTE — ED Provider Notes (Signed)
 -----------------------------------------   10:19 PM on 12/19/2016 -----------------------------------------  Assumed care from Dr. Don Perkingveronese. Workup unremarkable. Patient did have significant constipation and received an enema. Had a large bowel movement and is feeling much better. Vital signs stable. We'll discharge home to continue MiraLAX for her constipation.   Sharman CheekStafford, Brittany Hilbun, MD 12/19/16 2220

## 2016-12-19 NOTE — ED Notes (Signed)
Pt reports large BM, states pain slightly better

## 2016-12-21 LAB — URINE CULTURE

## 2017-03-27 ENCOUNTER — Emergency Department: Payer: BC Managed Care – PPO

## 2017-03-27 ENCOUNTER — Encounter: Payer: Self-pay | Admitting: Emergency Medicine

## 2017-03-27 ENCOUNTER — Emergency Department
Admission: EM | Admit: 2017-03-27 | Discharge: 2017-03-27 | Disposition: A | Discharge status: Home / Self Care | Payer: BC Managed Care – PPO | Attending: Emergency Medicine | Admitting: Emergency Medicine

## 2017-03-27 DIAGNOSIS — W010XXA Fall on same level from slipping, tripping and stumbling without subsequent striking against object, initial encounter: Secondary | ICD-10-CM | POA: Diagnosis not present

## 2017-03-27 DIAGNOSIS — Y92009 Unspecified place in unspecified non-institutional (private) residence as the place of occurrence of the external cause: Secondary | ICD-10-CM | POA: Diagnosis not present

## 2017-03-27 DIAGNOSIS — E119 Type 2 diabetes mellitus without complications: Secondary | ICD-10-CM | POA: Diagnosis not present

## 2017-03-27 DIAGNOSIS — Y939 Activity, unspecified: Secondary | ICD-10-CM | POA: Insufficient documentation

## 2017-03-27 DIAGNOSIS — Z79899 Other long term (current) drug therapy: Secondary | ICD-10-CM | POA: Insufficient documentation

## 2017-03-27 DIAGNOSIS — Y999 Unspecified external cause status: Secondary | ICD-10-CM | POA: Insufficient documentation

## 2017-03-27 DIAGNOSIS — S82821A Torus fracture of lower end of right fibula, initial encounter for closed fracture: Secondary | ICD-10-CM

## 2017-03-27 DIAGNOSIS — S8991XA Unspecified injury of right lower leg, initial encounter: Secondary | ICD-10-CM | POA: Diagnosis present

## 2017-03-27 MED ORDER — TRAMADOL HCL 50 MG PO TABS
50.0000 mg | ORAL_TABLET | Freq: Once | ORAL | Status: AC
  Administered 2017-03-27: 50 mg via ORAL

## 2017-03-27 MED ORDER — ONDANSETRON 4 MG PO TBDP: 4.0000 mg | ORAL_TABLET | Freq: Three times a day (TID) | ORAL | 0 refills | Status: DC | PRN

## 2017-03-27 MED ORDER — TRAMADOL HCL 50 MG PO TABS
ORAL_TABLET | ORAL | Status: AC
  Filled 2017-03-27: qty 1

## 2017-03-27 MED ORDER — TRAMADOL HCL 50 MG PO TABS: 50.0000 mg | ORAL_TABLET | Freq: Four times a day (QID) | ORAL | 0 refills | Status: DC | PRN

## 2017-03-27 MED ORDER — ONDANSETRON 4 MG PO TBDP
4.0000 mg | ORAL_TABLET | Freq: Once | ORAL | Status: AC
  Administered 2017-03-27: 4 mg via ORAL

## 2017-03-27 MED ORDER — ONDANSETRON 4 MG PO TBDP
ORAL_TABLET | ORAL | Status: AC
  Filled 2017-03-27: qty 1

## 2017-03-27 NOTE — ED Notes (Signed)
See triage note  States she slipped this am on ice  Twisted right ankle  Was sent in from [place] urgent care for possible fx and treatment

## 2017-03-27 NOTE — ED Triage Notes (Signed)
Slipped and fell 3am, pain R foot. Went to [facility], has xray report with her.

## 2017-03-27 NOTE — ED Provider Notes (Signed)
[facility] Emergency Department Provider Note   ____________________________________________   First MD Initiated Contact with Patient 03/27/17 1326     (approximate)  I have reviewed the triage vital signs and the nursing notes.   HISTORY  Chief Complaint Foot Pain   HPI Brittany Wood is a 61 y.o. female is here after an injury to her right ankle. Patient states that she slipped and fell at 3 AM in her home. She denies any head injury or loss of consciousness. Patient states that she went to [facility] urgent care to x-ray her ankle and sent her to the emergency department. Patient arrived with a cane but no splint applied to her ankle. Currently she rates her pain as 7 out of 10.   Past Medical History:  Diagnosis Date  . Diabetes mellitus without complication (HCC)   . Thyroid disease     There are no active problems to display for this patient.   Past Surgical History:  Procedure Laterality Date  . ABDOMINAL HYSTERECTOMY    . REDUCTION MAMMAPLASTY Bilateral 1983   scars around nipples and under breast from the surgery    Prior to Admission medications   Medication Sig Start Date End Date Taking? Authorizing Provider  cholecalciferol (VITAMIN D) 1000 units tablet Take 1,000 Units by mouth daily.    [provider]  escitalopram (LEXAPRO) 20 MG tablet Take 20 mg by mouth daily. 12/09/16   [provider]  levothyroxine (SYNTHROID, LEVOTHROID) 200 MCG tablet Take 200 mcg by mouth daily. 12/06/16   [provider]  LORazepam (ATIVAN) 0.5 MG tablet Take 0.5 mg by mouth 3 (three) times daily as needed. 12/09/16   [provider]  ondansetron (ZOFRAN ODT) 4 MG disintegrating tablet Take 1 tablet (4 mg total) by mouth every 8 (eight) hours as needed for nausea or vomiting. 03/27/17   Tommi Rumps, PA-C  traMADol (ULTRAM) 50 MG tablet Take 1 tablet (50 mg total) by mouth every 6 (six) hours as needed. 03/27/17  03/27/18  Tommi Rumps, PA-C    Allergies Codeine  Family History  Problem Relation Age of Onset  . Ovarian cancer Mother   . Breast cancer Maternal Aunt        mat great aunt    Social History Social History  Substance Use Topics  . Smoking status: Never Smoker  . Smokeless tobacco: Never Used  . Alcohol use Yes    Review of Systems Constitutional: No fever/chills Cardiovascular: Denies chest pain. Respiratory: Denies shortness of breath. Gastrointestinal: No abdominal pain.  No nausea, no vomiting.  Musculoskeletal: Positive right ankle pain. Skin: Negative for rash. Neurological: Negative for  focal weakness or numbness. ____________________________________________   PHYSICAL EXAM:  VITAL SIGNS: ED Triage Vitals  Enc Vitals Group     BP 03/27/17 1308 104/74     Pulse Rate 03/27/17 1308 (!) 102     Resp 03/27/17 1308 20     Temp 03/27/17 1308 99 F (37.2 C)     Temp Source 03/27/17 1308 Oral     SpO2 03/27/17 1308 96 %     Weight 03/27/17 1310 194 lb (88 kg)     Height 03/27/17 1310  (1.702 m)     Head Circumference --      Peak Flow --      Pain Score 03/27/17 1308 7     Pain Loc --      Pain Edu? --  Excl. in GC? --    Constitutional: Alert and oriented. Well appearing and in no acute distress. Eyes: Conjunctivae are normal.  Head: Atraumatic. Neck: No stridor.  No cervical tenderness on palpation posteriorly. Range of motion is without restriction. Cardiovascular: Normal rate, regular rhythm. Grossly normal heart sounds.  Good peripheral circulation. Respiratory: Normal respiratory effort.  No retractions. Lungs CTAB. Musculoskeletal: On examination the right ankle there is moderate soft tissue edema on the lateral aspect. Skin is intact. Patient has guarded movement of her ankle secondary to pain. There is no point tenderness on palpation on the dorsal aspect of the right foot. There is no soft tissue swelling. Patient is able move digits  without any difficulty. Motor sensory function intact. Capillary refill is less than 3 seconds. Neurologic:  Normal speech and language. No gross focal neurologic deficits are appreciated. No gait instability. Skin:  Skin is warm, dry and intact. No ecchymosis or abrasions seen. Psychiatric: Mood and affect are normal. Speech and behavior are normal.  ____________________________________________   LABS (all labs ordered are listed, but only abnormal results are displayed)  Labs Reviewed - No data to display  RADIOLOGY  Dg Knee 2 Views Right  Result Date: 03/27/2017 CLINICAL DATA:  Tender right knee after fall last night, known right distal fibula fx EXAM: RIGHT KNEE - 1-2 VIEW COMPARISON:  Right ankle same day FINDINGS: There is mild degenerative change of the patellofemoral compartment. Positioning is nonstandard, likely due to ankle fracture. There is no acute fracture or subluxation at the knee. IMPRESSION: Degenerative changes.  No evidence for acute  abnormality. Electronically Signed   By: Norva Pavlov M.D.   On: 03/27/2017 14:40   Dg Ankle Complete Right  Result Date: 03/27/2017 CLINICAL DATA:  Larey Seat last night, swelling and tender lateral right ankle EXAM: RIGHT ANKLE - COMPLETE 3+ VIEW COMPARISON:  None. FINDINGS: There is an oblique fracture of the lateral malleolus, associated significant soft tissue swelling. The mortise is intact. A plantar spur is present. IMPRESSION: Oblique fracture of the lateral malleolus. Electronically Signed   By: Norva Pavlov M.D.   On: 03/27/2017 14:39    ____________________________________________   PROCEDURES  Procedure(s) performed: None  Procedures  Critical Care performed: No  ____________________________________________   INITIAL IMPRESSION / ASSESSMENT AND PLAN / ED COURSE  Pertinent labs & imaging results that were available during my care of the patient were reviewed by me and considered in my medical decision making (see  chart for details).  Patient presented to the emergency department with a CD of her x-rays however these images were unable to be opened. Ankle was read x-ray and patient was given an explanation of what bone was broken. Patient requested crutches. She is placed in an OCL splint to be worn until she is able to see the orthopedist. She is to ice and elevate to reduce swelling. Patient was also discharged with tramadol and Zofran. She will call and make an appointment with Dr. Joice Lofts at the orthopedic [facility] at [facility].   ___________________________________________   FINAL CLINICAL IMPRESSION(S) / ED DIAGNOSES  Final diagnoses:  Closed torus fracture of distal end of right fibula, initial encounter      NEW MEDICATIONS STARTED DURING THIS VISIT:  Discharge Medication List as of 03/27/2017  3:35 PM    START taking these medications   Details  traMADol (ULTRAM) 50 MG tablet Take 1 tablet (50 mg total) by mouth every 6 (six) hours as needed., Starting Sat 03/27/2017, Until Sun  03/27/2018, Print         Note:  This document was prepared using Dragon voice recognition software and may include unintentional dictation errors.    Tommi Rumps, PA-C 03/27/17 1836    Merrily Brittle, MD 03/28/17 1054

## 2017-03-27 NOTE — Discharge Instructions (Signed)
Ice and elevate as needed for swelling and pain. Take pain medication only as directed and Zofran if needed for nausea. Leave splint on until seen by the orthopedist. Use crutches for weightbearing. Call Monday to make an appointment with the orthopedist.

## 2017-09-17 ENCOUNTER — Other Ambulatory Visit: Payer: Self-pay | Admitting: Family Medicine

## 2017-09-17 DIAGNOSIS — Z1231 Encounter for screening mammogram for malignant neoplasm of breast: Secondary | ICD-10-CM

## 2017-09-29 DIAGNOSIS — M654 Radial styloid tenosynovitis [de Quervain]: Secondary | ICD-10-CM | POA: Insufficient documentation

## 2017-10-11 ENCOUNTER — Ambulatory Visit
Admission: RE | Admit: 2017-10-11 | Discharge: 2017-10-11 | Disposition: A | Discharge status: Home / Self Care | Payer: BC Managed Care – PPO | Source: Ambulatory Visit | Attending: Family Medicine | Admitting: Family Medicine

## 2017-10-11 DIAGNOSIS — Z1231 Encounter for screening mammogram for malignant neoplasm of breast: Secondary | ICD-10-CM | POA: Diagnosis present

## 2017-11-19 ENCOUNTER — Ambulatory Visit: Payer: BC Managed Care – PPO | Admitting: Family Medicine

## 2017-11-19 ENCOUNTER — Encounter: Payer: Self-pay | Admitting: Family Medicine

## 2017-11-19 VITALS — BP 114/62 | HR 95 | Temp 98.6°F | Resp 12 | Ht 67.63 in | Wt 171.8 lb

## 2017-11-19 DIAGNOSIS — R945 Abnormal results of liver function studies: Secondary | ICD-10-CM | POA: Insufficient documentation

## 2017-11-19 DIAGNOSIS — K760 Fatty (change of) liver, not elsewhere classified: Secondary | ICD-10-CM

## 2017-11-19 DIAGNOSIS — Z794 Long term (current) use of insulin: Secondary | ICD-10-CM

## 2017-11-19 DIAGNOSIS — F32A Depression, unspecified: Secondary | ICD-10-CM | POA: Insufficient documentation

## 2017-11-19 DIAGNOSIS — Z79899 Other long term (current) drug therapy: Secondary | ICD-10-CM

## 2017-11-19 DIAGNOSIS — E039 Hypothyroidism, unspecified: Secondary | ICD-10-CM

## 2017-11-19 DIAGNOSIS — F329 Major depressive disorder, single episode, unspecified: Secondary | ICD-10-CM | POA: Insufficient documentation

## 2017-11-19 DIAGNOSIS — R9439 Abnormal result of other cardiovascular function study: Secondary | ICD-10-CM | POA: Insufficient documentation

## 2017-11-19 DIAGNOSIS — E119 Type 2 diabetes mellitus without complications: Secondary | ICD-10-CM

## 2017-11-19 DIAGNOSIS — K8020 Calculus of gallbladder without cholecystitis without obstruction: Secondary | ICD-10-CM | POA: Insufficient documentation

## 2017-11-19 DIAGNOSIS — G4730 Sleep apnea, unspecified: Secondary | ICD-10-CM | POA: Insufficient documentation

## 2017-11-19 MED ORDER — ESCITALOPRAM OXALATE 20 MG PO TABS: 20.0000 mg | ORAL_TABLET | Freq: Every day | ORAL | 11 refills | Status: DC

## 2017-11-19 MED ORDER — LEVOTHYROXINE SODIUM 200 MCG PO TABS: 200.0000 ug | ORAL_TABLET | Freq: Every day | ORAL | 0 refills | Status: DC

## 2017-11-19 MED ORDER — BUSPIRONE HCL 10 MG PO TABS: 10.0000 mg | ORAL_TABLET | Freq: Three times a day (TID) | ORAL | 2 refills | Status: DC | PRN

## 2017-11-19 NOTE — Progress Notes (Signed)
BP 114/62   Pulse 95   Temp 98.6 F (37 C) (Oral)   Resp 12   Ht 5' 7.63" (1.718 m)   Wt 171 lb 12.8 oz (77.9 kg)   SpO2 95%   BMI 26.41 kg/m    Subjective:    Patient ID: Brittany Wood, female    DOB: Nov 09, 1955, 61 y.o.   MRN: [id number]  HPI: Brittany Wood is a 62 y.o. female  Chief Complaint  Patient presents with  . New Patient (Initial Visit)    HPI She is a new patient She saw a new doctor; had "buckets of blood drawn" but never got the results ([facility]); done March 6th Lots of her medicines got changed  She has been taking lexapro 20 mg daily; prior doctor wanted to change it; both depression and anxiety; seeing counselor for years; years before, tough stuff; not in the family  Hypothyroidism; going on for 10 years; treated for about 5 years; 200 mcg is indeed a stable chrnoic dose and does not tolerate lower dose; no chest pain or heart palpitations;   Her A1c was too high to register when she got checked out for bariatric surgery; started directly on insulin and never started on oral agents; always thirsty even as a child; last eye exam a little over a year ago; no damage; she will make her own appt; they will get tingly at night and take arthritis strength tylenol and that helps; no damage from diabetes to her knowledge; no fam hx of diabetes  She takes lasix for swelling, "monster hands" and ankles; had extensive heart testing done before gastric sleeve; did [employer] stress tests and all fine; father has severe heart failure  She has been on ativan for a long time; she is single, works full time, takes care of her father full time and takes care of cat [employer]; stressed all the time; just has to take the edge off or will lose it;   Abnormal liver tests noted in the chart June 2018; not excessive tylenol; one glass of wine rarely, once a month, none since January; radial styloid tenosynovitis; cortisone shot at [facility]; years ago, diagnosed with sleep apnea, 4-5  years ago; now she has lost weight, no issues  Depression screen PHQ 2/9 11/19/2017  Decreased Interest 0  Down, Depressed, Hopeless 1  PHQ - 2 Score 1    Relevant past medical, surgical, family and social history reviewed Past Medical History:  Diagnosis Date  . Diabetes mellitus without complication (HCC)   . Thyroid disease    Past Surgical History:  Procedure Laterality Date  . ABDOMINAL HYSTERECTOMY    . LAPAROSCOPIC GASTRIC SLEEVE RESECTION  01/2015  . REDUCTION MAMMAPLASTY Bilateral 1983   scars around nipples and under breast from the surgery   Family History  Problem Relation Age of Onset  . Ovarian cancer Mother   . Breast cancer Maternal Aunt        mat great aunt  . Heart failure Father   . COPD Father   . Cancer Maternal Grandfather   . Heart attack Paternal Grandmother   . COPD Paternal Grandfather    Social History   Tobacco Use  . Smoking status: Never Smoker  . Smokeless tobacco: Never Used  Substance Use Topics  . Alcohol use: Not Currently  . Drug use: Never  MD note: no alcohol  Interim medical history since last visit reviewed. Allergies and medications reviewed  Review of Systems Per HPI unless  specifically indicated above     Objective:    BP 114/62   Pulse 95   Temp 98.6 F (37 C) (Oral)   Resp 12   Ht 5' 7.63" (1.718 m)   Wt 171 lb 12.8 oz (77.9 kg)   SpO2 95%   BMI 26.41 kg/m   Wt Readings from Last 3 Encounters:  11/19/17 171 lb 12.8 oz (77.9 kg)  03/27/17 194 lb (88 kg)  12/19/16 198 lb (89.8 kg)    Physical Exam  Constitutional: She appears well-developed and well-nourished. No distress.  HENT:  Head: Normocephalic and atraumatic.  Eyes: EOM are normal. No scleral icterus.  Neck: No thyromegaly present.  Cardiovascular: Normal rate, regular rhythm and normal heart sounds.  No murmur heard. Pulmonary/Chest: Effort normal and breath sounds normal. No respiratory distress. She has no wheezes.  Abdominal: Soft. Bowel  sounds are normal. She exhibits no distension.  Musculoskeletal: She exhibits no edema.  Neurological: She is alert. She exhibits normal muscle tone.  Skin: Skin is warm and dry. She is not diaphoretic. No pallor.  Psychiatric: She has a normal mood and affect. Her behavior is normal. Judgment and thought content normal.   Diabetic Foot Form - Detailed   Diabetic Foot Exam - detailed Diabetic Foot exam was performed with the following findings:  Yes 11/19/2017  4:42 PM  Pulse Foot Exam completed.:  Yes  Right Dorsalis Pedis:  Present Left Dorsalis Pedis:  Present  Sensory Foot Exam Completed.:  Yes Semmes-Weinstein Monofilament Test R Site 1-Great Toe:  Pos L Site 1-Great Toe:  Pos           Assessment & Plan:   Problem List Items Addressed This Visit      Digestive   Steatosis of liver    Request copy of labs done through [facility]; if SGOT and SGPT not included, draw at f/u        Endocrine   Hypothyroidism    Monitor TSH, request labs just done by other doctor; adjust med if needed; weight loss noted, will see if dose needs adjusting      Relevant Medications   levothyroxine (SYNTHROID, LEVOTHROID) 200 MCG tablet   Diabetes mellitus type 2, uncomplicated (HCC)    Request copies of labs done recently at [facility]; get additional labs as needed (A1c if due, urine microalb:Cr, etc.)      Relevant Medications   insulin detemir (LEVEMIR) 100 UNIT/ML injection   insulin aspart (NOVOLOG) 100 UNIT/ML injection     Other   Insulin long-term use (HCC)    Patient does not know if she is type 1 or type 2; unusual that all she uses is insulin; will request records; request labs done recently       Other Visit Diagnoses    Chronic prescription benzodiazepine use    -  Primary   discussed risk of benzo, will taper her OFF of these over the next month; close f/u; no further refills; SSRI and buspar to take place       Follow up plan: Return in about 1 month (around 12/22/2017)  for follow-up visit with Dr. Sherie Don.  An after-visit summary was printed and given to the patient at check-out.  Please see the patient instructions which may contain other information and recommendations beyond what is mentioned above in the assessment and plan.  Meds ordered this encounter  Medications  . busPIRone (BUSPAR) 10 MG tablet    Sig: Take 1 tablet (10 mg total) by  mouth 3 (three) times daily as needed. Do not take with the lorazepam; this replaces the lorazepam    Dispense:  90 tablet    Refill:  2  . escitalopram (LEXAPRO) 20 MG tablet    Sig: Take 1 tablet (20 mg total) by mouth daily.    Dispense:  30 tablet    Refill:  11  . levothyroxine (SYNTHROID, LEVOTHROID) 200 MCG tablet    Sig: Take 1 tablet (200 mcg total) by mouth daily.    Dispense:  30 tablet    Refill:  0  . LORazepam (ATIVAN) 0.5 MG tablet    Sig: 1.5 pills by mouth daily x 10 days, then 1 pill daily x 10 days, then 0.5 pills daily x 10 days; follow taper instructions    Dispense:  30 tablet    Refill:  0    No orders of the defined types were placed in this encounter.

## 2017-11-19 NOTE — Patient Instructions (Addendum)
Request echocardiogram stress test from 2013 and other testing from 2013 from [facility] Request labs from [facility] done September 01, 2017  Taper the lorazepam as follows: Start with one of a pill in the morning and one-half pill in the later afternoon/evening for 10 days Then one-half of a pill twice a day for 10 days Then one-half of a pill during the day for 10 days Then stop Use the new buspirone in place of the lorazepam   Steps to Elicit the Relaxation Response The following is the technique reprinted with permission from Dr. Billy Fischer book The Relaxation Response pages 162-163 1. Sit quietly in a comfortable position. 2. Close your eyes. 3. Deeply relax all your muscles,  beginning at your feet and progressing up to your face.  Keep them relaxed. 4. Breathe through your nose.  Become aware of your breathing.  As you breathe out, say the word, "one"*,  silently to yourself. For example,  breathe in ... out, "one",- in .. out, "one", etc.  Breathe easily and naturally. 5. Continue for 10 to 20 minutes.  You may open your eyes to check the time, but do not use an alarm.  When you finish, sit quietly for several minutes,  at first with your eyes closed and later with your eyes opened.  Do not stand up for a few minutes. 6. Do not worry about whether you are successful  in achieving a deep level of relaxation.  Maintain a passive attitude and permit relaxation to occur at its own pace.  When distracting thoughts occur,  try to ignore them by not dwelling upon them  and return to repeating "one."  With practice, the response should come with little effort.  Practice the technique once or twice daily,  but not within two hours after any meal,  since the digestive processes seem to interfere with  the elicitation of the Relaxation Response. * It is better to use a soothing, mellifluous sound, preferably with no meaning. or association, to avoid stimulation of unnecessary thoughts  - a mantra. 12 Ways to Curb Anxiety  ?Anxiety is normal human sensation. It is what helped our ancestors survive the pitfalls of the wilderness. Anxiety is defined as experiencing worry or nervousness about an imminent event or something with an uncertain outcome. It is a feeling experienced by most people at some point in their lives. Anxiety can be triggered by a very personal issue, such as the illness of a loved one, or an event of global proportions, such as a refugee crisis. Some of the symptoms of anxiety are:  Feeling restless.  Having a feeling of impending danger.  Increased heart rate.  Rapid breathing. Sweating.  Shaking.  Weakness or feeling tired.  Difficulty concentrating on anything except the current worry.  Insomnia.  Stomach or bowel problems. What can we do about anxiety we may be feeling? There are many techniques to help manage stress and relax. Here are 12 ways you can reduce your anxiety almost immediately: 1. Turn off the constant feed of information. Take a social media sabbatical. Studies have shown that social media directly contributes to social anxiety.  2. Monitor your television viewing habits. Are you watching shows that are also contributing to your anxiety, such as 24-hour news stations? Try watching something else, or better yet, nothing at all. Instead, listen to music, read an inspirational book or practice a hobby. 3. Eat nutritious meals. Also, don't skip meals and keep healthful snacks on hand. Hunger  and poor diet contributes to feeling anxious. 4. Sleep. Sleeping on a regular schedule for at least seven to eight hours a night will do wonders for your outlook when you are awake. 5. Exercise. Regular exercise will help rid your body of that anxious energy and help you get more restful sleep. 6. Try deep (diaphragmatic) breathing. Inhale slowly through your nose for five seconds and exhale through your mouth. 7. Practice acceptance and gratitude. When  anxiety hits, accept that there are things out of your control that shouldn't be of immediate concern.  8. Seek out humor. When anxiety strikes, watch a funny video, read jokes or call a friend who makes you laugh. Laughter is healing for our bodies and releases endorphins that are calming. 9. Stay positive. Take the effort to replace negative thoughts with positive ones. Try to see a stressful situation in a positive light. Try to come up with solutions rather than dwelling on the problem. 10. Figure out what triggers your anxiety. Keep a journal and make note of anxious moments and the events surrounding them. This will help you identify triggers you can avoid or even eliminate. 11. Talk to someone. Let a trusted friend, family member or even trained professional know that you are feeling overwhelmed and anxious. Verbalize what you are feeling and why.  12. Volunteer. If your anxiety is triggered by a crisis on a large scale, become an advocate and work to resolve the problem that is causing you unease. Anxiety is often unwelcome and can become overwhelming. If not kept in check, it can become a disorder that could require medical treatment. However, if you take the time to care for yourself and avoid the triggers that make you anxious, you will be able to find moments of relaxation and clarity that make your life much more enjoyable.

## 2017-11-23 DIAGNOSIS — Z794 Long term (current) use of insulin: Secondary | ICD-10-CM | POA: Insufficient documentation

## 2017-11-23 MED ORDER — LORAZEPAM 0.5 MG PO TABS: ORAL_TABLET | ORAL | 0 refills | Status: DC

## 2017-11-23 NOTE — Assessment & Plan Note (Signed)
Patient does not know if she is type 1 or type 2; unusual that all she uses is insulin; will request records; request labs done recently

## 2017-11-23 NOTE — Assessment & Plan Note (Signed)
Request copies of labs done recently at [facility]; get additional labs as needed (A1c if due, urine microalb:Cr, etc.)

## 2017-11-23 NOTE — Assessment & Plan Note (Signed)
Monitor TSH, request labs just done by other doctor; adjust med if needed; weight loss noted, will see if dose needs adjusting

## 2017-11-23 NOTE — Assessment & Plan Note (Signed)
Request copy of labs done through [facility]; if SGOT and SGPT not included, draw at f/u

## 2017-12-23 ENCOUNTER — Encounter: Payer: Self-pay | Admitting: Family Medicine

## 2017-12-23 ENCOUNTER — Ambulatory Visit (INDEPENDENT_AMBULATORY_CARE_PROVIDER_SITE_OTHER): Payer: BC Managed Care – PPO | Admitting: Family Medicine

## 2017-12-23 VITALS — BP 88/50 | HR 96 | Temp 98.3°F | Resp 16 | Ht 67.63 in | Wt 162.9 lb

## 2017-12-23 DIAGNOSIS — Z1159 Encounter for screening for other viral diseases: Secondary | ICD-10-CM

## 2017-12-23 DIAGNOSIS — K760 Fatty (change of) liver, not elsewhere classified: Secondary | ICD-10-CM

## 2017-12-23 DIAGNOSIS — I959 Hypotension, unspecified: Secondary | ICD-10-CM | POA: Diagnosis not present

## 2017-12-23 DIAGNOSIS — Z794 Long term (current) use of insulin: Secondary | ICD-10-CM

## 2017-12-23 DIAGNOSIS — E039 Hypothyroidism, unspecified: Secondary | ICD-10-CM

## 2017-12-23 DIAGNOSIS — F330 Major depressive disorder, recurrent, mild: Secondary | ICD-10-CM

## 2017-12-23 DIAGNOSIS — E119 Type 2 diabetes mellitus without complications: Secondary | ICD-10-CM

## 2017-12-23 DIAGNOSIS — R945 Abnormal results of liver function studies: Secondary | ICD-10-CM | POA: Diagnosis not present

## 2017-12-23 NOTE — Assessment & Plan Note (Signed)
Check liver enzymes 

## 2017-12-23 NOTE — Assessment & Plan Note (Signed)
Previous Hashimoto's; sensitive to dose changes; brand name medically necessary Synthroid

## 2017-12-23 NOTE — Assessment & Plan Note (Signed)
Check liver enzymes today 

## 2017-12-23 NOTE — Progress Notes (Signed)
BP (!) 88/50 (BP Location: Right Arm, Cuff Size: Normal)   Pulse 96   Temp 98.3 F (36.8 C) (Oral)   Resp 16   Ht 5' 7.63" (1.718 m)   Wt 162 lb 14.4 oz (73.9 kg)   SpO2 97%   BMI 25.04 kg/m    Subjective:    Patient ID: Brittany Wood, female    DOB: 10/24/1955, 62 y.o.   MRN: [id number]  HPI: Brittany Wood is a 62 y.o. female  Chief Complaint  Patient presents with  . Follow-up    1 month F/U    HPI Patient is here for f/u; established care one month ago with me  The first that I noted was that her blood pressure was low on check-in with the CMA; I asked her to check in both arms and get orthostatics; the patient says that she feels fine; she has been drinking enough fluids  Today's Vitals   12/23/17 1344 12/23/17 1355 12/23/17 1357  BP: (!) 86/42 (!) 82/42 (!) 88/50  Pulse: 96    Resp: 16    Temp: 98.3 F (36.8 C)    TempSrc: Oral    SpO2: 97%    Weight: 162 lb 14.4 oz (73.9 kg)    Height: 5' 7.63" (1.718 m)    The first two pressures were with a LARGE cuff Recheck with regular sized cuff was still 88/50 Orthostatics ordered; not orthostatic She feels fine; no blood anywhere Losing weight on purpose through [employer]; she eats protein, fruits and veggies; eating better than she has even eaten in her life; one shake a day with 30 grams of protein Drinking plenty of fluids, 100 oucnes of water a day Not presyncopal, no passing out episodes; walked 2 miles with her dog last night Her BP has always been low, parents BP has been low too; patient is not alarmed at all by today's BP   She has tapered off of the medicine; "it was rough" but she is off; it is getting better, but more nervous, things can make her angrier quicker; she did it and came off as directed The buspar is an 8 out of 10 in effectiveness Still on lexapro 20 mg daily; stable and doing well  She has hypothyroidism; I'll be managing that; she has had issues for 15 years; diagnosed at  first with Hashimoto's thyroiditis; the first medicine did not make much difference; doctor said it had to be name brand only and still must be; very sensitive to dose adjustments; stable for 1.5 years  Insulin are sliding scale; still taking same long-acting; in the last 2 weeks highest FSBS was 218, lowest about 80; she can tell, just a little symptomatic; it would drop into the 40s in school; last eye exam one year ago, will go in August; no damage  [facility] 12/23/2017 11/19/2017  Decreased Interest 1 0  Down, Depressed, Hopeless 1 1  PHQ - 2 Score 2 1  Altered sleeping 0 -  Tired, decreased energy 1 -  Change in appetite 0 -  Feeling bad or failure about yourself  1 -  Trouble concentrating 1 -  Moving slowly or fidgety/restless 0 -  Suicidal thoughts 0 -  PHQ-9 Score 5 -  Difficult doing work/chores Somewhat difficult -   Fall Risk  12/23/2017 11/19/2017  Falls in the past year? Yes Yes  Number falls in past yr: 2 or more 2 or more  Injury with Fall? Yes  Yes  Comment - 2 seprate fall broke right ankle, and right wrist   Relevant past medical, surgical, family and social history reviewed Past Medical History:  Diagnosis Date  . Diabetes mellitus without complication (HCC)   . Thyroid disease    Past Surgical History:  Procedure Laterality Date  . ABDOMINAL HYSTERECTOMY    . LAPAROSCOPIC GASTRIC SLEEVE RESECTION  01/2015  . REDUCTION MAMMAPLASTY Bilateral 1983   scars around nipples and under breast from the surgery   Family History  Problem Relation Age of Onset  . Ovarian cancer Mother   . Breast cancer Maternal Aunt        mat great aunt  . Heart failure Father   . COPD Father   . Cancer Maternal Grandfather   . Heart attack Paternal Grandmother   . COPD Paternal Grandfather    Social History   Tobacco Use  . Smoking status: Never Smoker  . Smokeless tobacco: Never Used  Substance Use Topics  . Alcohol use: Not Currently  . Drug use: Never     Interim medical history since last visit reviewed. Allergies and medications reviewed  Review of Systems  Constitutional: Negative for unexpected weight change (going to [employer], working hard for this weight loss).   Per HPI unless specifically indicated above     Objective:    BP (!) 88/50 (BP Location: Right Arm, Cuff Size: Normal)   Pulse 96   Temp 98.3 F (36.8 C) (Oral)   Resp 16   Ht 5' 7.63" (1.718 m)   Wt 162 lb 14.4 oz (73.9 kg)   SpO2 97%   BMI 25.04 kg/m   Wt Readings from Last 3 Encounters:  12/23/17 162 lb 14.4 oz (73.9 kg)  11/19/17 171 lb 12.8 oz (77.9 kg)  03/27/17 194 lb (88 kg)  MD note: intentional weight loss with [employer]; bmi was 39 at one point  Physical Exam  Constitutional: She appears well-developed and well-nourished. No distress.  Weight loss acknowledged, discussed  HENT:  Head: Normocephalic and atraumatic.  Eyes: EOM are normal. No scleral icterus.  Neck: No thyromegaly present.  Cardiovascular: Normal rate, regular rhythm and normal heart sounds.  No murmur heard. Pulmonary/Chest: Effort normal and breath sounds normal. No respiratory distress. She has no wheezes.  Abdominal: Soft. Bowel sounds are normal. She exhibits no distension.  Musculoskeletal: Normal range of motion. She exhibits no edema.  Neurological: She is alert. She exhibits normal muscle tone.  Skin: Skin is warm and dry. She is not diaphoretic. No pallor.  Psychiatric: She has a normal mood and affect. Her behavior is normal. Judgment and thought content normal.   Diabetic Foot Form - Detailed   Diabetic Foot Exam - detailed Diabetic Foot exam was performed with the following findings:  Yes 12/23/2017  1:45 PM  Visual Foot Exam completed.:  Yes  Pulse Foot Exam completed.:  Yes  Right Dorsalis Pedis:  Present Left Dorsalis Pedis:  Present  Sensory Foot Exam Completed.:  Yes Semmes-Weinstein Monofilament Test R Site 1-Great Toe:  Pos L Site 1-Great  Toe:  Pos          Assessment & Plan:   Problem List Items Addressed This Visit      Digestive   Steatosis of liver    Check liver enzymes        Endocrine   Hypothyroidism - Primary    Previous Hashimoto's; sensitive to dose changes; brand name medically necessary Synthroid  Relevant Orders   TSH   Diabetes mellitus type 2, uncomplicated (HCC)    Check urine microalb:Cr, A1c, glucose; foot exam by MD today      Relevant Orders   Microalbumin / creatinine urine ratio   Lipid panel   Hemoglobin A1c   COMPLETE METABOLIC PANEL WITH GFR     Other   Insulin long-term use (HCC)    She was started on insulin, never did pills of any kind; considered additional testing, but decided against because of $$$; will continue treatment as currently doing      Depression    She'll continue the SSRI      Abnormal liver function    Check liver enzymes today       Other Visit Diagnoses    Encounter for hepatitis C screening test for low risk patient       order birth cohort testing   Hypotension, unspecified hypotension type       patient looks and feels fine today; not tachycardic, not pale; not orthostatic; will have her stay hdyrated, call if any issues   Relevant Orders   CBC with Differential/Platelet       Follow up plan: Return in about 3 months (around 03/25/2018) for follow-up visit with Dr. Sherie Don.  An after-visit summary was printed and given to the patient at check-out.  Please see the patient instructions which may contain other information and recommendations beyond what is mentioned above in the assessment and plan.  No orders of the defined types were placed in this encounter.   Orders Placed This Encounter  Procedures  . Microalbumin / creatinine urine ratio  . Lipid panel  . Hemoglobin A1c  . COMPLETE METABOLIC PANEL WITH GFR  . TSH  . CBC with Differential/Platelet

## 2017-12-23 NOTE — Assessment & Plan Note (Signed)
Check urine microalb:Cr, A1c, glucose; foot exam by MD today

## 2017-12-23 NOTE — Assessment & Plan Note (Signed)
She was started on insulin, never did pills of any kind; considered additional testing, but decided against because of $$$; will continue treatment as currently doing

## 2017-12-23 NOTE — Patient Instructions (Addendum)
Stay hydrated Let's get labs today If you have not heard anything from my staff in a week about any orders/referrals/studies from today, please contact us here to follow-up [phone] Please have the eye doctor send me a copy of her or his note

## 2017-12-23 NOTE — Assessment & Plan Note (Signed)
She'll continue the SSRI

## 2017-12-24 ENCOUNTER — Other Ambulatory Visit: Payer: Self-pay | Admitting: Family Medicine

## 2017-12-24 DIAGNOSIS — E039 Hypothyroidism, unspecified: Secondary | ICD-10-CM

## 2017-12-24 DIAGNOSIS — E119 Type 2 diabetes mellitus without complications: Secondary | ICD-10-CM

## 2017-12-24 DIAGNOSIS — Z794 Long term (current) use of insulin: Secondary | ICD-10-CM

## 2017-12-24 LAB — CBC WITH DIFFERENTIAL/PLATELET
Basophils Absolute: 59 cells/uL (ref 0–200)
Basophils Relative: 0.8 %
EOS PCT: 6.7 %
Eosinophils Absolute: 496 cells/uL (ref 15–500)
HCT: 39.2 % (ref 35.0–45.0)
Hemoglobin: 13 g/dL (ref 11.7–15.5)
Lymphs Abs: 2642 cells/uL (ref 850–3900)
MCH: 28.7 pg (ref 27.0–33.0)
MCHC: 33.2 g/dL (ref 32.0–36.0)
MCV: 86.5 fL (ref 80.0–100.0)
MPV: 11.2 fL (ref 7.5–12.5)
Monocytes Relative: 7.4 %
NEUTROS PCT: 49.4 %
Neutro Abs: 3656 cells/uL (ref 1500–7800)
PLATELETS: 282 10*3/uL (ref 140–400)
RBC: 4.53 10*6/uL (ref 3.80–5.10)
RDW: 11.9 % (ref 11.0–15.0)
TOTAL LYMPHOCYTE: 35.7 %
WBC: 7.4 10*3/uL (ref 3.8–10.8)
WBCMIX: 548 {cells}/uL (ref 200–950)

## 2017-12-24 LAB — COMPLETE METABOLIC PANEL WITH GFR
AG Ratio: 1.7 (calc) (ref 1.0–2.5)
ALKALINE PHOSPHATASE (APISO): 104 U/L (ref 33–130)
ALT: 20 U/L (ref 6–29)
AST: 13 U/L (ref 10–35)
Albumin: 4 g/dL (ref 3.6–5.1)
BILIRUBIN TOTAL: 0.4 mg/dL (ref 0.2–1.2)
BUN: 20 mg/dL (ref 7–25)
CO2: 28 mmol/L (ref 20–32)
CREATININE: 0.65 mg/dL (ref 0.50–0.99)
Calcium: 9.6 mg/dL (ref 8.6–10.4)
Chloride: 105 mmol/L (ref 98–110)
GFR, EST NON AFRICAN AMERICAN: 96 mL/min/{1.73_m2} (ref >=60)
GFR, Est African American: 112 mL/min/{1.73_m2} (ref >=60)
GLUCOSE: 110 mg/dL — AB (ref 65–99)
Globulin: 2.4 g/dL (calc) (ref 1.9–3.7)
POTASSIUM: 4 mmol/L (ref 3.5–5.3)
SODIUM: 140 mmol/L (ref 135–146)
TOTAL PROTEIN: 6.4 g/dL (ref 6.1–8.1)

## 2017-12-24 LAB — LIPID PANEL
Cholesterol: 153 mg/dL (ref <200)
HDL: 44 mg/dL — ABNORMAL LOW (ref >50)
LDL Cholesterol (Calc): 85 mg/dL (calc)
NON-HDL CHOLESTEROL (CALC): 109 mg/dL (ref <130)
Total CHOL/HDL Ratio: 3.5 (calc) (ref <5.0)
Triglycerides: 143 mg/dL (ref <150)

## 2017-12-24 LAB — MICROALBUMIN / CREATININE URINE RATIO
Creatinine, Urine: 68 mg/dL (ref 20–275)
Microalb Creat Ratio: 10 mcg/mg creat (ref <30)
Microalb, Ur: 0.7 mg/dL

## 2017-12-24 LAB — HEMOGLOBIN A1C
EAG (MMOL/L): 7.3 (calc)
Hgb A1c MFr Bld: 6.2 % of total Hgb — ABNORMAL HIGH (ref <5.7)
MEAN PLASMA GLUCOSE: 131 (calc)

## 2017-12-24 LAB — TSH: TSH: 0.02 mIU/L — ABNORMAL LOW (ref 0.40–4.50)

## 2017-12-24 MED ORDER — LEVOTHYROXINE SODIUM 175 MCG PO TABS: 175.0000 ug | ORAL_TABLET | Freq: Every day | ORAL | 1 refills | Status: DC

## 2017-12-24 MED ORDER — ATORVASTATIN CALCIUM 10 MG PO TABS: 10.0000 mg | ORAL_TABLET | ORAL | 1 refills | Status: DC

## 2017-12-24 NOTE — Progress Notes (Signed)
Reduce dose, recheck TSH on or just after August 12th Start low dose statin; check lipids then too

## 2018-03-08 ENCOUNTER — Encounter: Payer: Self-pay | Admitting: Family Medicine

## 2018-03-09 ENCOUNTER — Encounter: Payer: Self-pay | Admitting: Family Medicine

## 2018-03-09 ENCOUNTER — Ambulatory Visit: Payer: BC Managed Care – PPO | Admitting: Family Medicine

## 2018-03-09 VITALS — BP 128/70 | HR 90 | Temp 98.4°F | Ht 67.0 in | Wt 156.4 lb

## 2018-03-09 DIAGNOSIS — E039 Hypothyroidism, unspecified: Secondary | ICD-10-CM

## 2018-03-09 DIAGNOSIS — F4321 Adjustment disorder with depressed mood: Secondary | ICD-10-CM | POA: Diagnosis not present

## 2018-03-09 DIAGNOSIS — Z794 Long term (current) use of insulin: Secondary | ICD-10-CM | POA: Diagnosis not present

## 2018-03-09 DIAGNOSIS — E119 Type 2 diabetes mellitus without complications: Secondary | ICD-10-CM | POA: Diagnosis not present

## 2018-03-09 MED ORDER — BUSPIRONE HCL 15 MG PO TABS: 15.0000 mg | ORAL_TABLET | Freq: Three times a day (TID) | ORAL | 2 refills | Status: DC

## 2018-03-09 NOTE — Assessment & Plan Note (Signed)
Due for recheck of TSH; adjust medicine if needed

## 2018-03-09 NOTE — Patient Instructions (Addendum)
Return for NON-fasting labs in the next week 8-12 or 2-4 are times for labs without appointment  Increase the buspirone to 15 mg at a time, three times a day Continue to work with your therapist I am here for you when needed Cancel appointment for next week Return in late December for visit and labs (NON-fasting)

## 2018-03-09 NOTE — Assessment & Plan Note (Signed)
Foot exam by MD; next A1c due in Nov

## 2018-03-09 NOTE — Progress Notes (Signed)
BP 128/70   Pulse 90   Temp 98.4 F (36.9 C)   Ht 5\' 7"  (1.702 m)   Wt 156 lb 6.4 oz (70.9 kg)   SpO2 99%   BMI 24.50 kg/m    Subjective:    Patient ID: Brittany Wood, female    DOB: 1955/12/15, 62 y.o.   MRN: [id number]  HPI: Brittany Wood is a 62 y.o. female  Chief Complaint  Patient presents with  . Paperwork    FMLA for father passing away    HPI Patient is here for an acute visit to have paperwork done for work leave Her father died 08-21-24and this has really affected her She witnessed the traumatic death of father on 02-17-23; they were alone when he fell and hit his head in the garage; he just wanted to get home and never made it in the house; he had heart failure, at [employer] and she was bringing him home when he fell, hit his head, and died She was really caring for him; he was 60 years old She is seeing a PhD psychologist She was seeing him falling every time she closed her eyes at first She takes OTC benadryl at times; getting better with time No significant night-time awakening She has no SI or HI She does not want to switch SSRI On buspar but could be increased She had an abnormal thyroid test a few months ago; due for recheck; she has lost weight Cholesterol also due for recheck  Depression screen [facility] 2/9 03/09/2018 12/23/2017 11/19/2017  Decreased Interest 3 1 0  Down, Depressed, Hopeless 3 1 1   PHQ - 2 Score 6 2 1   Altered sleeping 0 0 -  Tired, decreased energy 1 1 -  Change in appetite 0 0 -  Feeling bad or failure about yourself  0 1 -  Trouble concentrating 1 1 -  Moving slowly or fidgety/restless 0 0 -  Suicidal thoughts 0 0 -  PHQ-9 Score 8 5 -  Difficult doing work/chores Very difficult Somewhat difficult -    Relevant past medical, surgical, family and social history reviewed Past Medical History:  Diagnosis Date  . Diabetes mellitus without complication (HCC)   . Thyroid disease    Past Surgical History:    Procedure Laterality Date  . ABDOMINAL HYSTERECTOMY    . LAPAROSCOPIC GASTRIC SLEEVE RESECTION  01/2015  . REDUCTION MAMMAPLASTY Bilateral 1983   scars around nipples and under breast from the surgery   Family History  Problem Relation Age of Onset  . Ovarian cancer Mother   . Breast cancer Maternal Aunt        mat great aunt  . Heart failure Father   . COPD Father   . Cancer Maternal Grandfather   . Heart attack Paternal Grandmother   . COPD Paternal Grandfather    Social History   Tobacco Use  . Smoking status: Never Smoker  . Smokeless tobacco: Never Used  Substance Use Topics  . Alcohol use: Not Currently  . Drug use: Never    Interim medical history since last visit reviewed. Allergies and medications reviewed  Review of Systems Per HPI unless specifically indicated above     Objective:    BP 128/70   Pulse 90   Temp 98.4 F (36.9 C)   Ht 5\' 7"  (1.702 m)   Wt 156 lb 6.4 oz (70.9 kg)   SpO2 99%   BMI 24.50 kg/m   Wt  Readings from Last 3 Encounters:  03/09/18 156 lb 6.4 oz (70.9 kg)  12/23/17 162 lb 14.4 oz (73.9 kg)  11/19/17 171 lb 12.8 oz (77.9 kg)    Physical Exam  Constitutional: She appears well-developed and well-nourished. No distress.  Eyes: EOM are normal. No scleral icterus.  Neck: No thyromegaly present.  Cardiovascular: Normal rate.  Pulmonary/Chest: Effort normal.  Abdominal: She exhibits no distension.  Skin: No pallor.  Psychiatric: Her behavior is normal. Judgment and thought content normal. Her mood appears not anxious. She exhibits a depressed mood.   Results for orders placed or performed in visit on 12/23/17  Microalbumin / creatinine urine ratio  Result Value Ref Range   Creatinine, Urine 68 20 - 275 mg/dL   Microalb, Ur 0.7 mg/dL   Microalb Creat Ratio 10 <30 mcg/mg creat  Lipid panel  Result Value Ref Range   Cholesterol 153 <200 mg/dL   HDL 44 (L) >16 mg/dL   Triglycerides 109 <604 mg/dL   LDL Cholesterol (Calc) 85  mg/dL (calc)   Total CHOL/HDL Ratio 3.5 <5.0 (calc)   Non-HDL Cholesterol (Calc) 109 <130 mg/dL (calc)  Hemoglobin V4U  Result Value Ref Range   Hgb A1c MFr Bld 6.2 (H) <5.7 % of total Hgb   Mean Plasma Glucose 131 (calc)   eAG (mmol/L) 7.3 (calc)  COMPLETE METABOLIC PANEL WITH GFR  Result Value Ref Range   Glucose, Bld 110 (H) 65 - 99 mg/dL   BUN 20 7 - 25 mg/dL   Creat 9.81 1.91 - 4.78 mg/dL   GFR, Est Non African American 96 > OR = 60 mL/min/1.22m2   GFR, Est African American 112 > OR = 60 mL/min/1.64m2   BUN/Creatinine Ratio NOT APPLICABLE 6 - 22 (calc)   Sodium 140 135 - 146 mmol/L   Potassium 4.0 3.5 - 5.3 mmol/L   Chloride 105 98 - 110 mmol/L   CO2 28 20 - 32 mmol/L   Calcium 9.6 8.6 - 10.4 mg/dL   Total Protein 6.4 6.1 - 8.1 g/dL   Albumin 4.0 3.6 - 5.1 g/dL   Globulin 2.4 1.9 - 3.7 g/dL (calc)   AG Ratio 1.7 1.0 - 2.5 (calc)   Total Bilirubin 0.4 0.2 - 1.2 mg/dL   Alkaline phosphatase (APISO) 104 33 - 130 U/L   AST 13 10 - 35 U/L   ALT 20 6 - 29 U/L  TSH  Result Value Ref Range   TSH 0.02 (L) 0.40 - 4.50 mIU/L  CBC with Differential/Platelet  Result Value Ref Range   WBC 7.4 3.8 - 10.8 Thousand/uL   RBC 4.53 3.80 - 5.10 Million/uL   Hemoglobin 13.0 11.7 - 15.5 g/dL   HCT 29.5 62.1 - 30.8 %   MCV 86.5 80.0 - 100.0 fL   MCH 28.7 27.0 - 33.0 pg   MCHC 33.2 32.0 - 36.0 g/dL   RDW 65.7 84.6 - 96.2 %   Platelets 282 140 - 400 Thousand/uL   MPV 11.2 7.5 - 12.5 fL   Neutro Abs 3,656 1,500 - 7,800 cells/uL   Lymphs Abs 2,642 850 - 3,900 cells/uL   WBC mixed population 548 200 - 950 cells/uL   Eosinophils Absolute 496 15 - 500 cells/uL   Basophils Absolute 59 0 - 200 cells/uL   Neutrophils Relative % 49.4 %   Total Lymphocyte 35.7 %   Monocytes Relative 7.4 %   Eosinophils Relative 6.7 %   Basophils Relative 0.8 %      Assessment &  Plan:   Problem List Items Addressed This Visit      Endocrine   Hypothyroidism    Due for recheck of TSH; adjust medicine if  needed      Diabetes mellitus type 2, uncomplicated (HCC)    Foot exam by MD; next A1c due in Nov       Other Visit Diagnoses    Grief reaction    -  Primary   supportive listening provided; out of work from Aug 19th through October 6th, return on October 7th; continue SSRI, increase buspar       Follow up plan: No follow-ups on file.  An after-visit summary was printed and given to the patient at check-out.  Please see the patient instructions which may contain other information and recommendations beyond what is mentioned above in the assessment and plan.  Meds ordered this encounter  Medications  . busPIRone (BUSPAR) 15 MG tablet    Sig: Take 1 tablet (15 mg total) by mouth 3 (three) times daily.    Dispense:  90 tablet    Refill:  2    No orders of the defined types were placed in this encounter.

## 2018-03-25 ENCOUNTER — Ambulatory Visit: Payer: BC Managed Care – PPO | Admitting: Family Medicine

## 2018-04-19 ENCOUNTER — Encounter: Payer: Self-pay | Admitting: Family Medicine

## 2018-04-19 ENCOUNTER — Telehealth: Payer: Self-pay | Admitting: Family Medicine

## 2018-04-19 ENCOUNTER — Other Ambulatory Visit: Payer: Self-pay | Admitting: Nurse Practitioner

## 2018-04-19 DIAGNOSIS — R600 Localized edema: Secondary | ICD-10-CM

## 2018-04-19 NOTE — Telephone Encounter (Signed)
Copied from CRM [id number]. Topic: Quick Communication - Rx Refill/Question >> Apr 19, 2018  9:47 AM Gaynelle Adu wrote: Medication: furosemide (LASIX) 20 MG tablet  Has the patient contacted their pharmacy?no   Preferred Pharmacy (with phone number or street name): [id number] [place], [place] - [address] AT [facility] OF [address] [phone] (Phone) [phone] (Fax)    Agent: Please be advised that RX refills may take up to 3 business days. We ask that you follow-up with your pharmacy.

## 2018-04-19 NOTE — Telephone Encounter (Signed)
Left detailed voicemail that we are not the pre scriber, she will need to contact them.

## 2018-04-20 MED ORDER — FUROSEMIDE 20 MG PO TABS: 20.0000 mg | ORAL_TABLET | Freq: Every day | ORAL | 2 refills | Status: DC | PRN

## 2018-04-20 NOTE — Addendum Note (Signed)
Addended by: Tiffiny Worthy, Janit Bern on: 04/20/2018 09:56 AM   Modules accepted: Orders

## 2018-05-08 ENCOUNTER — Other Ambulatory Visit: Payer: Self-pay | Admitting: Family Medicine

## 2018-05-08 DIAGNOSIS — E039 Hypothyroidism, unspecified: Secondary | ICD-10-CM

## 2018-05-09 ENCOUNTER — Encounter: Payer: Self-pay | Admitting: Family Medicine

## 2018-05-09 NOTE — Telephone Encounter (Signed)
Called patient she states she works full time and want be able to get labs done!

## 2018-05-09 NOTE — Telephone Encounter (Signed)
Pt.notified

## 2018-05-09 NOTE — Telephone Encounter (Signed)
Then please order labs to be done at the hospital where she can go any hour of the day I must have these labs or I'll have to significantly reduce her dose to avoid the risk of her being over-replaced and that will likely cause fatigue and weight gain This medicine must be monitored

## 2018-05-09 NOTE — Telephone Encounter (Signed)
Patient was due for a recheck of her thyroid function around August 12th Please have her get the labs ASAP I'll give very limited Rx

## 2018-05-10 ENCOUNTER — Telehealth: Payer: Self-pay | Admitting: Family Medicine

## 2018-05-10 NOTE — Telephone Encounter (Signed)
Copied from CRM #[id number]. Topic: Quick Communication - Rx Refill/Question >> May 10, 2018 12:38 PM Burchel, Abbi R [phone]: Medication: levothyroxine (SYNTHROID, LEVOTHROID) 175 MCG tablet   Jeannine KittenFarah states rx was only written for 3 tablets, please clarify.   Preferred Pharmacy:  [id number] - [place], [place] - [address] AT [facility] [place] [address] [place] [id number] Phone: [phone] Fax: [phone]

## 2018-05-10 NOTE — Telephone Encounter (Signed)
Yes, she needs to have labs done so I can see if the dose is correct

## 2018-05-11 NOTE — Telephone Encounter (Signed)
Brittany MuirJamie, please cancel any outstanding lab orders

## 2018-09-09 ENCOUNTER — Other Ambulatory Visit: Payer: Self-pay | Admitting: Family Medicine

## 2018-09-09 DIAGNOSIS — Z1231 Encounter for screening mammogram for malignant neoplasm of breast: Secondary | ICD-10-CM

## 2018-10-17 ENCOUNTER — Ambulatory Visit: Payer: BC Managed Care – PPO

## 2018-12-05 ENCOUNTER — Ambulatory Visit
Admission: RE | Admit: 2018-12-05 | Discharge: 2018-12-05 | Disposition: A | Discharge status: Home / Self Care | Payer: BC Managed Care – PPO | Source: Ambulatory Visit | Attending: Family Medicine | Admitting: Family Medicine

## 2018-12-05 ENCOUNTER — Other Ambulatory Visit: Payer: Self-pay

## 2018-12-05 DIAGNOSIS — Z1231 Encounter for screening mammogram for malignant neoplasm of breast: Secondary | ICD-10-CM | POA: Insufficient documentation

## 2019-10-30 ENCOUNTER — Other Ambulatory Visit: Payer: Self-pay | Admitting: Family Medicine

## 2019-10-30 DIAGNOSIS — Z1231 Encounter for screening mammogram for malignant neoplasm of breast: Secondary | ICD-10-CM

## 2019-12-11 ENCOUNTER — Ambulatory Visit
Admission: RE | Admit: 2019-12-11 | Discharge: 2019-12-11 | Disposition: A | Discharge status: Home / Self Care | Payer: BC Managed Care – PPO | Source: Ambulatory Visit | Attending: Family Medicine | Admitting: Family Medicine

## 2019-12-11 ENCOUNTER — Other Ambulatory Visit: Payer: Self-pay

## 2019-12-11 ENCOUNTER — Ambulatory Visit (LOCAL_COMMUNITY_HEALTH_CENTER): Payer: Self-pay | Admitting: Family Medicine

## 2019-12-11 DIAGNOSIS — Z1231 Encounter for screening mammogram for malignant neoplasm of breast: Secondary | ICD-10-CM

## 2019-12-11 DIAGNOSIS — Z111 Encounter for screening for respiratory tuberculosis: Secondary | ICD-10-CM

## 2019-12-14 ENCOUNTER — Ambulatory Visit (LOCAL_COMMUNITY_HEALTH_CENTER): Payer: Self-pay

## 2019-12-14 ENCOUNTER — Other Ambulatory Visit: Payer: Self-pay

## 2019-12-14 DIAGNOSIS — Z111 Encounter for screening for respiratory tuberculosis: Secondary | ICD-10-CM

## 2019-12-14 LAB — TB SKIN TEST
Induration: 0 mm
TB Skin Test: NEGATIVE

## 2022-12-10 ENCOUNTER — Ambulatory Visit (INDEPENDENT_AMBULATORY_CARE_PROVIDER_SITE_OTHER): Payer: Medicare Other | Admitting: Family

## 2022-12-10 ENCOUNTER — Encounter: Payer: Self-pay | Admitting: Family

## 2022-12-10 VITALS — BP 116/78 | HR 74 | Temp 98.3°F | Ht 68.0 in | Wt 217.8 lb

## 2022-12-10 DIAGNOSIS — E559 Vitamin D deficiency, unspecified: Secondary | ICD-10-CM | POA: Diagnosis not present

## 2022-12-10 DIAGNOSIS — I499 Cardiac arrhythmia, unspecified: Secondary | ICD-10-CM | POA: Insufficient documentation

## 2022-12-10 DIAGNOSIS — Z794 Long term (current) use of insulin: Secondary | ICD-10-CM

## 2022-12-10 DIAGNOSIS — E119 Type 2 diabetes mellitus without complications: Secondary | ICD-10-CM

## 2022-12-10 DIAGNOSIS — F418 Other specified anxiety disorders: Secondary | ICD-10-CM | POA: Diagnosis not present

## 2022-12-10 DIAGNOSIS — M654 Radial styloid tenosynovitis [de Quervain]: Secondary | ICD-10-CM

## 2022-12-10 DIAGNOSIS — E785 Hyperlipidemia, unspecified: Secondary | ICD-10-CM | POA: Diagnosis not present

## 2022-12-10 DIAGNOSIS — Z1231 Encounter for screening mammogram for malignant neoplasm of breast: Secondary | ICD-10-CM

## 2022-12-10 DIAGNOSIS — E669 Obesity, unspecified: Secondary | ICD-10-CM

## 2022-12-10 DIAGNOSIS — M25532 Pain in left wrist: Secondary | ICD-10-CM | POA: Insufficient documentation

## 2022-12-10 DIAGNOSIS — R1311 Dysphagia, oral phase: Secondary | ICD-10-CM

## 2022-12-10 DIAGNOSIS — G4730 Sleep apnea, unspecified: Secondary | ICD-10-CM

## 2022-12-10 DIAGNOSIS — Z1211 Encounter for screening for malignant neoplasm of colon: Secondary | ICD-10-CM

## 2022-12-10 DIAGNOSIS — Z0001 Encounter for general adult medical examination with abnormal findings: Secondary | ICD-10-CM

## 2022-12-10 DIAGNOSIS — Z8669 Personal history of other diseases of the nervous system and sense organs: Secondary | ICD-10-CM

## 2022-12-10 DIAGNOSIS — E1169 Type 2 diabetes mellitus with other specified complication: Secondary | ICD-10-CM

## 2022-12-10 DIAGNOSIS — E039 Hypothyroidism, unspecified: Secondary | ICD-10-CM | POA: Diagnosis not present

## 2022-12-10 DIAGNOSIS — K760 Fatty (change of) liver, not elsewhere classified: Secondary | ICD-10-CM

## 2022-12-10 DIAGNOSIS — Z0000 Encounter for general adult medical examination without abnormal findings: Secondary | ICD-10-CM

## 2022-12-10 DIAGNOSIS — E069 Thyroiditis, unspecified: Secondary | ICD-10-CM

## 2022-12-10 DIAGNOSIS — Z780 Asymptomatic menopausal state: Secondary | ICD-10-CM

## 2022-12-10 LAB — MICROALBUMIN / CREATININE URINE RATIO
Creatinine,U: 34.8 mg/dL
Microalb Creat Ratio: 2 mg/g (ref 0.0–30.0)
Microalb, Ur: <0.7 mg/dL (ref 0.0–1.9)

## 2022-12-10 LAB — COMPREHENSIVE METABOLIC PANEL
ALT: 27 U/L (ref 0–35)
AST: 20 U/L (ref 0–37)
Albumin: 4.1 g/dL (ref 3.5–5.2)
Alkaline Phosphatase: 73 U/L (ref 39–117)
BUN: 14 mg/dL (ref 6–23)
CO2: 26 mEq/L (ref 19–32)
Calcium: 9.1 mg/dL (ref 8.4–10.5)
Chloride: 103 mEq/L (ref 96–112)
Creatinine, Ser: 0.67 mg/dL (ref 0.40–1.20)
GFR: 91.82 mL/min (ref >60.00)
Glucose, Bld: 140 mg/dL — ABNORMAL HIGH (ref 70–99)
Potassium: 4.8 mEq/L (ref 3.5–5.1)
Sodium: 137 mEq/L (ref 135–145)
Total Bilirubin: 0.3 mg/dL (ref 0.2–1.2)
Total Protein: 6.3 g/dL (ref 6.0–8.3)

## 2022-12-10 LAB — LIPID PANEL
Cholesterol: 261 mg/dL — ABNORMAL HIGH (ref 0–200)
HDL: 57.3 mg/dL (ref >39.00)
LDL Cholesterol: 176 mg/dL — ABNORMAL HIGH (ref 0–99)
NonHDL: 204.05
Total CHOL/HDL Ratio: 5
Triglycerides: 141 mg/dL (ref 0.0–149.0)
VLDL: 28.2 mg/dL (ref 0.0–40.0)

## 2022-12-10 LAB — T4, FREE: Free T4: 0.4 ng/dL — ABNORMAL LOW (ref 0.60–1.60)

## 2022-12-10 LAB — VITAMIN D 25 HYDROXY (VIT D DEFICIENCY, FRACTURES): VITD: 7.86 ng/mL — ABNORMAL LOW (ref 30.00–100.00)

## 2022-12-10 LAB — TSH: TSH: 57.09 u[IU]/mL — ABNORMAL HIGH (ref 0.35–5.50)

## 2022-12-10 LAB — HEMOGLOBIN A1C: Hgb A1c MFr Bld: 7.4 % — ABNORMAL HIGH (ref 4.6–6.5)

## 2022-12-10 MED ORDER — ESCITALOPRAM OXALATE 20 MG PO TABS
20.0000 mg | ORAL_TABLET | Freq: Every day | ORAL | 3 refills | Status: DC
Start: 2022-12-10 — End: 2024-04-04

## 2022-12-10 NOTE — Assessment & Plan Note (Signed)
Ordered hga1c today pending results. Work on diabetic diet and exercise as tolerated. Yearly foot exam, and annual eye exam.   

## 2022-12-10 NOTE — Progress Notes (Signed)
New Patient Office Visit  Subjective:  Patient ID: Brittany Wood, female    DOB: 05-Oct-1955  Age: 67 y.o. MRN: [id number]  CC:  Chief Complaint  Patient presents with   Establish Care    HPI Brittany Wood is here to establish care as a new patient.  Oriented to practice routines and expectations.  Prior provider was: last Brittany Wood , last visit was three years ago   Pt is with acute concerns.   Over the last six months with left wrist tenderness, burns at times and feels a knot in this area. She does take tylenol arthritis for this few times a week , and wears a brace prn or when she is doing any lifting. No h/o injury or trauma.   Tetanus has had < 10 y/o  Prevnar 20: has not had this yet, declines today.  Mammogram: overdue will order Colonoscopy: declines, would prefer a cologuard  chronic concerns:  Hypothyroid: not been on meds for a few years was on 175 mcg once daily with fatigue, dry skin, and weight gain.   Sleep apnea: was on cpap on the past for a few weeks but has since come off of it. Does c/o fatigue. She is not sure if she snores because she lives alone.   Diabetes:  Lab Results  Component Value Date   HGBA1C 7.4 (H) 12/10/2022    Lab Results  Component Value Date   CHOL 261 (H) 12/10/2022   HDL 57.30 12/10/2022   LDLCALC 176 (H) 12/10/2022   TRIG 141.0 12/10/2022   CHOLHDL 5 12/10/2022       ROS: Negative unless specifically indicated above in HPI.   Current Outpatient Medications:    cholecalciferol (VITAMIN D) 1000 units tablet, Take 1,000 Units by mouth daily., Disp: , Rfl:    Cholecalciferol 1.25 MG (50000 UT) TABS, Take 1 tablet by mouth once a week., Disp: 8 tablet, Rfl: 0   escitalopram (LEXAPRO) 20 MG tablet, Take 1 tablet (20 mg total) by mouth daily., Disp: 90 tablet, Rfl: 3   furosemide (LASIX) 20 MG tablet, Take 1 tablet (20 mg total) by mouth daily as needed., Disp: 10 tablet, Rfl: 2   levothyroxine (SYNTHROID) 150 MCG  tablet, Take 1 tablet (150 mcg total) by mouth daily., Disp: 90 tablet, Rfl: 3   metFORMIN (GLUCOPHAGE) 500 MG tablet, Take 1 tablet (500 mg total) by mouth 2 (two) times daily with a meal., Disp: 180 tablet, Rfl: 3 Past Medical History:  Diagnosis Date   Diabetes mellitus without complication (HCC)    Thyroid disease    Past Surgical History:  Procedure Laterality Date   LAPAROSCOPIC GASTRIC SLEEVE RESECTION  01/2015   REDUCTION MAMMAPLASTY Bilateral 1983   scars around nipples and under breast from the surgery   TOTAL VAGINAL HYSTERECTOMY      Objective:   Today's Vitals: BP 116/78   Pulse 74   Temp 98.3 F (36.8 C) (Oral)   Ht 5\' 8"  (1.727 m)   Wt 217 lb 12.8 oz (98.8 kg)   SpO2 97%   BMI 33.12 kg/m   Physical Exam Constitutional:      General: She is not in acute distress.    Appearance: Normal appearance. She is obese. She is not ill-appearing.  HENT:     Head: Normocephalic.     Right Ear: Tympanic membrane normal.     Left Ear: Tympanic membrane normal.     Nose: Nose normal.  Mouth/Throat:     Mouth: Mucous membranes are moist.  Eyes:     Extraocular Movements: Extraocular movements intact.     Pupils: Pupils are equal, round, and reactive to light.  Neck:     Thyroid: Thyromegaly (left greater than right) present.  Cardiovascular:     Rate and Rhythm: Normal rate and regular rhythm.  Pulmonary:     Effort: Pulmonary effort is normal.     Breath sounds: Normal breath sounds.  Abdominal:     General: Abdomen is flat. Bowel sounds are normal.     Palpations: Abdomen is soft.     Tenderness: There is no guarding or rebound.  Musculoskeletal:        General: Normal range of motion.     Cervical back: Normal range of motion.  Skin:    General: Skin is warm.     Capillary Refill: Capillary refill takes less than 2 seconds.  Neurological:     General: No focal deficit present.     Mental Status: She is alert.  Psychiatric:        Mood and Affect: Mood  normal.        Behavior: Behavior normal.        Thought Content: Thought content normal.        Judgment: Judgment normal.     Assessment & Plan:  Anxiety with depression Assessment & Plan: stable  Orders: -     Escitalopram Oxalate; Take 1 tablet (20 mg total) by mouth daily.  Dispense: 90 tablet; Refill: 3  History of obstructive sleep apnea Assessment & Plan: Did order a new sleep study with referral to pulmonary    Obesity (BMI 30-39.9) -     Ambulatory referral to Pulmonology  Sleep apnea, unspecified type  Acquired hypothyroidism Assessment & Plan: Not currently on medication  Repeat free t4 and tsh  Pending results will likely start levothyroxine Goiter on exam, ordering u/s thyroid pending results  Orders: -     TSH -     T4, free -     Microalbumin / creatinine urine ratio -     US THYROID; Future  Type 2 diabetes mellitus without complication, with long-term current use of insulin (HCC) Assessment & Plan: Ordered hga1c today pending results. Work on diabetic diet and exercise as tolerated. Yearly foot exam, and annual eye exam.     Orders: -     Hemoglobin A1c -     Ambulatory referral to Ophthalmology  Radial styloid tenosynovitis Assessment & Plan: Advised pt to wear nightly brace If no improvement next step is referral to neurosurgery  Suspected carpal tunnel   Screening mammogram for breast cancer -     3D Screening Mammogram, Left and Right; Future  Postmenopausal Assessment & Plan: Bone density ordered pending results.    Orders: -     DG Bone Density; Future  Screening for colon cancer -     Cologuard  Encounter for general adult medical examination with abnormal findings Assessment & Plan: Patient Counseling(The following topics were reviewed):  Preventative care handout given to pt  Health maintenance and immunizations reviewed. Please refer to Health maintenance section. Pt advised on safe sex, wearing seatbelts in car,  and proper nutrition labwork ordered today for annual Dental health: Discussed importance of regular tooth brushing, flossing, and dental visits.   Orders: -     Hemoglobin A1c -     Comprehensive metabolic panel -     Lipid panel -  TSH -     T4, free -     Microalbumin / creatinine urine ratio  Hyperlipidemia associated with type 2 diabetes mellitus (HCC) Assessment & Plan: Ordered lipid panel, pending results. Work on low cholesterol diet and exercise as tolerated   Orders: -     Lipid panel  Vitamin D deficiency -     VITAMIN D 25 Hydroxy (Vit-D Deficiency, Fractures)  Oral phase dysphagia Assessment & Plan: Ordering u/s thyroid   Thyroiditis -     US THYROID; Future  Left wrist pain  Irregular heart rhythm Assessment & Plan: Pt asymptomatic, will assess labs if negative workup then will do EKG next visit   Steatosis of liver Assessment & Plan: Pt advised to work on diet and exercise as tolerated       Follow-up: Return in about 2 weeks (around 12/24/2022) for f/u heart rhythm.   Mort Sawyers, FNP

## 2022-12-10 NOTE — Assessment & Plan Note (Addendum)
Not currently on medication  Repeat free t4 and tsh  Pending results will likely start levothyroxine Goiter on exam, ordering u/s thyroid pending results

## 2022-12-10 NOTE — Patient Instructions (Addendum)
  I have sent an electronic order over to your preferred location for the following:   []   2D Mammogram  [x]   3D Mammogram  [x]   Bone Density   Please give this center a call to get scheduled at your convenience.  [x]   [facility]  [address] [place] [address]  [phone]  Make sure to wear two piece  clothing  No lotions powders or deodorants the day of the appointment Make sure to bring picture ID and insurance card.  Bring list of medications you are currently taking including any supplements.   ------------------------------------ Welcome to our clinic, I am happy to have you as my new patient. I am excited to continue on this healthcare journey with you. I have ordered imaging for you at [facility] outpatient diagnostic center for a thyroid ultrasound. This order has been sent over for you electronically.  Please call [phone] to schedule this appointment.  ------------------------------------  I have ordered imaging for you at [facility] outpatient diagnostic center for a thyroid ultrasound. This order has been sent over for you electronically.  Please call [phone] to schedule this appointment.   ------------------------------------   Stop by the lab prior to leaving today. I will notify you of your results once received.   Please keep in mind Any my chart messages you send have up to a three business day turnaround for a response.  Phone calls may take up to a one full business day turnaround for a  response.   If you need a medication refill I recommend you request it through the pharmacy as this is easiest for [place] rather than sending a message and or phone call.   Due to recent changes in healthcare laws, you may see results of your imaging and/or laboratory studies on MyChart before I have had a chance to review them.  I understand that in some cases there may be results that are confusing or  concerning to you. Please understand that not all results are received at the same time and often I may need to interpret multiple results in order to provide you with the best plan of care or course of treatment. Therefore, I ask that you please give me 2 business days to thoroughly review all your results before contacting my office for clarification. Should we see a critical lab result, you will be contacted sooner.   It was a pleasure seeing you today! Please do not hesitate to reach out with any questions and or concerns.  Regards,   Mort Sawyers FNP-C

## 2022-12-11 ENCOUNTER — Encounter: Payer: Self-pay | Admitting: *Deleted

## 2022-12-11 ENCOUNTER — Other Ambulatory Visit: Payer: Self-pay | Admitting: Family

## 2022-12-11 DIAGNOSIS — E1169 Type 2 diabetes mellitus with other specified complication: Secondary | ICD-10-CM | POA: Insufficient documentation

## 2022-12-11 DIAGNOSIS — E559 Vitamin D deficiency, unspecified: Secondary | ICD-10-CM

## 2022-12-11 DIAGNOSIS — F418 Other specified anxiety disorders: Secondary | ICD-10-CM | POA: Insufficient documentation

## 2022-12-11 DIAGNOSIS — E039 Hypothyroidism, unspecified: Secondary | ICD-10-CM

## 2022-12-11 DIAGNOSIS — E069 Thyroiditis, unspecified: Secondary | ICD-10-CM | POA: Insufficient documentation

## 2022-12-11 DIAGNOSIS — Z780 Asymptomatic menopausal state: Secondary | ICD-10-CM | POA: Insufficient documentation

## 2022-12-11 DIAGNOSIS — R1311 Dysphagia, oral phase: Secondary | ICD-10-CM | POA: Insufficient documentation

## 2022-12-11 DIAGNOSIS — Z0001 Encounter for general adult medical examination with abnormal findings: Secondary | ICD-10-CM | POA: Insufficient documentation

## 2022-12-11 DIAGNOSIS — E669 Obesity, unspecified: Secondary | ICD-10-CM | POA: Insufficient documentation

## 2022-12-11 DIAGNOSIS — E119 Type 2 diabetes mellitus without complications: Secondary | ICD-10-CM

## 2022-12-11 DIAGNOSIS — Z8669 Personal history of other diseases of the nervous system and sense organs: Secondary | ICD-10-CM | POA: Insufficient documentation

## 2022-12-11 MED ORDER — METFORMIN HCL 500 MG PO TABS
500.0000 mg | ORAL_TABLET | Freq: Two times a day (BID) | ORAL | 3 refills | Status: DC
Start: 2022-12-11 — End: 2024-03-31

## 2022-12-11 MED ORDER — CHOLECALCIFEROL 1.25 MG (50000 UT) PO TABS
1.0000 | ORAL_TABLET | ORAL | 0 refills | Status: AC
Start: 2022-12-11 — End: ?

## 2022-12-11 MED ORDER — LEVOTHYROXINE SODIUM 150 MCG PO TABS
150.0000 ug | ORAL_TABLET | Freq: Every day | ORAL | 3 refills | Status: DC
Start: 2022-12-11 — End: 2023-12-17

## 2022-12-11 NOTE — Progress Notes (Signed)
Have pt schedule f/u appt in three months, come fasting

## 2022-12-11 NOTE — Assessment & Plan Note (Signed)
Ordered lipid panel, pending results. Work on low cholesterol diet and exercise as tolerated  

## 2022-12-11 NOTE — Progress Notes (Signed)
A1c is slightly elevated, goal < 6.5 in diabetic range. I would like to restart metformin if she has been on this before. Start one tablet in am for a few days then increase to one twice daily if tolerating well. Work on diabetic diet and exercise as tolerated. Follow up in   Cholesterol is also very high. LDL is 176, goal <70. For now let's work on a strict low cholesterol diet, and treat the thyroid and diabetes and then repeat this.  Thyroid is VERY high. No wonder she feels so 'off' We will start by putting her back on 150 mcg once daily of levothyroxine make sure she takes this med separate from food and or other medications by 30 minutes, 4 hours seperated from vitamins and or acid reducing medications.  Lastly vitamin D is extremely low which can also contribute to fatigue and weight issues and even sometimes depressive symptoms. Sending in RX dose which she will complete, and then start DAILY vitamin D3 2000 IU once the RX is complete.

## 2022-12-11 NOTE — Assessment & Plan Note (Signed)
stable °

## 2022-12-11 NOTE — Assessment & Plan Note (Signed)
Pt advised to work on diet and exercise as tolerated  

## 2022-12-11 NOTE — Assessment & Plan Note (Signed)
Did order a new sleep study with referral to pulmonary

## 2022-12-11 NOTE — Assessment & Plan Note (Signed)
Bone density ordered pending results.   

## 2022-12-11 NOTE — Assessment & Plan Note (Signed)
Advised pt to wear nightly brace If no improvement next step is referral to neurosurgery  Suspected carpal tunnel

## 2022-12-11 NOTE — Assessment & Plan Note (Signed)
Pt asymptomatic, will assess labs if negative workup then will do EKG next visit

## 2022-12-11 NOTE — Assessment & Plan Note (Signed)
Ordering u/s thyroid

## 2022-12-11 NOTE — Assessment & Plan Note (Signed)
Patient Counseling(The following topics were reviewed): ? Preventative care handout given to pt  ?Health maintenance and immunizations reviewed. Please refer to Health maintenance section. ?Pt advised on safe sex, wearing seatbelts in car, and proper nutrition ?labwork ordered today for annual ?Dental health: Discussed importance of regular tooth brushing, flossing, and dental visits. ? ? ?

## 2022-12-18 ENCOUNTER — Encounter: Payer: Self-pay | Admitting: *Deleted

## 2023-01-07 NOTE — Telephone Encounter (Signed)
Any chance we could look into this further to see if there was anything with billing that I could cover better with codes? Or does she have to reach out to billing?

## 2023-02-11 ENCOUNTER — Encounter (INDEPENDENT_AMBULATORY_CARE_PROVIDER_SITE_OTHER): Payer: Self-pay

## 2023-02-12 ENCOUNTER — Other Ambulatory Visit: Payer: Self-pay | Admitting: Family

## 2023-02-12 DIAGNOSIS — E559 Vitamin D deficiency, unspecified: Secondary | ICD-10-CM

## 2023-02-12 NOTE — Telephone Encounter (Signed)
Received refill request on Vit D 50,000IU. Patient was given by another office. Ok to refill as pended or do you want her to be on over the counter dose.

## 2023-03-15 ENCOUNTER — Ambulatory Visit: Payer: Medicare Other | Admitting: Family

## 2023-04-01 ENCOUNTER — Encounter: Payer: Self-pay | Admitting: Family

## 2023-04-01 ENCOUNTER — Ambulatory Visit: Payer: Medicare HMO | Admitting: Family

## 2023-04-01 VITALS — BP 104/64 | HR 78 | Temp 97.1°F | Ht 67.0 in | Wt 214.0 lb

## 2023-04-01 DIAGNOSIS — E039 Hypothyroidism, unspecified: Secondary | ICD-10-CM

## 2023-04-01 DIAGNOSIS — E119 Type 2 diabetes mellitus without complications: Secondary | ICD-10-CM

## 2023-04-01 DIAGNOSIS — R600 Localized edema: Secondary | ICD-10-CM | POA: Diagnosis not present

## 2023-04-01 DIAGNOSIS — Z7984 Long term (current) use of oral hypoglycemic drugs: Secondary | ICD-10-CM | POA: Diagnosis not present

## 2023-04-01 LAB — TSH: TSH: 2.09 u[IU]/mL (ref 0.35–5.50)

## 2023-04-01 LAB — HEMOGLOBIN A1C: Hgb A1c MFr Bld: 8.4 % — ABNORMAL HIGH (ref 4.6–6.5)

## 2023-04-01 MED ORDER — FUROSEMIDE 20 MG PO TABS
20.0000 mg | ORAL_TABLET | Freq: Every day | ORAL | 2 refills | Status: AC | PRN
Start: 2023-04-01 — End: ?

## 2023-04-01 NOTE — Progress Notes (Signed)
Established Patient Office Visit  Subjective:      CC:  Chief Complaint  Patient presents with   Medical Management of Chronic Issues    HPI: Brittany Wood is a 67 y.o. female presenting on 04/01/2023 for Medical Management of Chronic Issues . Ordered sleep study last visit due to h/o OSA in need of re evaluation.   Hypothyroid: goiter palpated u/s thyroid had been ordered, but pt has not had this set up. TSH was very elevated, pt started on 150 mcg once daily of levothyroxine. Denies difficulty swallowing and or pain with eating.   Lab Results  Component Value Date   TSH 57.09 (H) 12/10/2022    Vitamin d def: completed RX of vitamin D  Last vitamin D Lab Results  Component Value Date   VD25OH 7.86 (L) 12/10/2022    DM2: advised to restart on metformin, on 500 mg twice daily. Lab Results  Component Value Date   HGBA1C 7.4 (H) 12/10/2022         Social history:  Relevant past medical, surgical, family and social history reviewed and updated as indicated. Interim medical history since our last visit reviewed.  Allergies and medications reviewed and updated.  DATA REVIEWED: CHART IN EPIC     ROS: Negative unless specifically indicated above in HPI.    Current Outpatient Medications:    cholecalciferol (VITAMIN D) 1000 units tablet, Take 1,000 Units by mouth daily., Disp: , Rfl:    escitalopram (LEXAPRO) 20 MG tablet, Take 1 tablet (20 mg total) by mouth daily., Disp: 90 tablet, Rfl: 3   levothyroxine (SYNTHROID) 150 MCG tablet, Take 1 tablet (150 mcg total) by mouth daily., Disp: 90 tablet, Rfl: 3   metFORMIN (GLUCOPHAGE) 500 MG tablet, Take 1 tablet (500 mg total) by mouth 2 (two) times daily with a meal., Disp: 180 tablet, Rfl: 3   furosemide (LASIX) 20 MG tablet, Take 1 tablet (20 mg total) by mouth daily as needed., Disp: 10 tablet, Rfl: 2      Objective:    BP 104/64   Pulse 78   Temp (!) 97.1 F (36.2 C) (Temporal)   Ht 5\' 7"  (1.702 m)    Wt 214 lb (97.1 kg)   SpO2 100%   BMI 33.52 kg/m   Wt Readings from Last 3 Encounters:  04/01/23 214 lb (97.1 kg)  12/10/22 217 lb 12.8 oz (98.8 kg)  03/09/18 156 lb 6.4 oz (70.9 kg)    Physical Exam Constitutional:      General: She is not in acute distress.    Appearance: Normal appearance. She is normal weight. She is not ill-appearing, toxic-appearing or diaphoretic.  HENT:     Head: Normocephalic.  Neck:     Thyroid: No thyromegaly or thyroid tenderness.  Cardiovascular:     Rate and Rhythm: Normal rate and regular rhythm.  Pulmonary:     Effort: Pulmonary effort is normal.  Musculoskeletal:        General: Normal range of motion.  Neurological:     General: No focal deficit present.     Mental Status: She is alert and oriented to person, place, and time. Mental status is at baseline.  Psychiatric:        Mood and Affect: Mood normal.        Behavior: Behavior normal.        Thought Content: Thought content normal.        Judgment: Judgment normal.  Assessment & Plan:  Pedal edema -     Furosemide; Take 1 tablet (20 mg total) by mouth daily as needed.  Dispense: 10 tablet; Refill: 2  Acquired hypothyroidism Assessment & Plan: Tsh ordered today pending results.  Can hold off of thyroid u/s as decreased in size since last visit , likely from addition of levothyroxine Continue levothyroxine 150 mcg once daily.  Orders: -     TSH  Type 2 diabetes mellitus without complication, with long-term current use of insulin (HCC) Assessment & Plan: Continue metformin 500 mg twice daily.  Ordered hga1c today pending results. Work on diabetic diet and exercise as tolerated. Yearly foot exam, and annual eye exam.    Orders: -     Hemoglobin A1c     Return in about 6 months (around 09/30/2023) for f/u thyroid.  Mort Sawyers, MSN, APRN, FNP-C [facility]

## 2023-04-01 NOTE — Assessment & Plan Note (Signed)
Continue metformin 500 mg twice daily.  Ordered hga1c today pending results. Work on diabetic diet and exercise as tolerated. Yearly foot exam, and annual eye exam.

## 2023-04-01 NOTE — Assessment & Plan Note (Signed)
Tsh ordered today pending results.  Can hold off of thyroid u/s as decreased in size since last visit , likely from addition of levothyroxine Continue levothyroxine 150 mcg once daily.

## 2023-04-02 ENCOUNTER — Other Ambulatory Visit: Payer: Self-pay | Admitting: Family

## 2023-04-02 DIAGNOSIS — Z794 Long term (current) use of insulin: Secondary | ICD-10-CM

## 2023-04-02 MED ORDER — SITAGLIPTIN PHOSPHATE 25 MG PO TABS: 25.0000 mg | ORAL_TABLET | Freq: Every day | ORAL | 3 refills | Status: DC

## 2023-09-03 ENCOUNTER — Telehealth: Payer: Self-pay | Admitting: Family

## 2023-09-03 NOTE — Telephone Encounter (Signed)
 Per our records, pt is due for her mammogram. LM for pt to return call.

## 2023-12-14 ENCOUNTER — Encounter: Admitting: Family

## 2023-12-17 ENCOUNTER — Telehealth: Payer: Self-pay

## 2023-12-17 DIAGNOSIS — E039 Hypothyroidism, unspecified: Secondary | ICD-10-CM

## 2023-12-17 MED ORDER — LEVOTHYROXINE SODIUM 150 MCG PO TABS
150.0000 ug | ORAL_TABLET | Freq: Every day | ORAL | 0 refills | Status: DC
Start: 2023-12-17 — End: 2024-03-20

## 2023-12-17 NOTE — Telephone Encounter (Signed)
 Received refill request on Levothyroxine . Patient will be due for follow up for additional refills. My chart message sent to call office to schedule.

## 2023-12-29 ENCOUNTER — Telehealth: Payer: Self-pay

## 2023-12-29 NOTE — Telephone Encounter (Signed)
 Pt is over due to follow up. Attempted to call pt to schedule an appointment with Tabitha, no answer and was unable to leave voicemail.  Mychart message sent.

## 2024-01-02 ENCOUNTER — Emergency Department

## 2024-01-02 ENCOUNTER — Other Ambulatory Visit: Payer: Self-pay

## 2024-01-02 DIAGNOSIS — E119 Type 2 diabetes mellitus without complications: Secondary | ICD-10-CM | POA: Insufficient documentation

## 2024-01-02 DIAGNOSIS — W010XXA Fall on same level from slipping, tripping and stumbling without subsequent striking against object, initial encounter: Secondary | ICD-10-CM | POA: Insufficient documentation

## 2024-01-02 DIAGNOSIS — M19012 Primary osteoarthritis, left shoulder: Secondary | ICD-10-CM | POA: Diagnosis not present

## 2024-01-02 DIAGNOSIS — S42202A Unspecified fracture of upper end of left humerus, initial encounter for closed fracture: Secondary | ICD-10-CM | POA: Insufficient documentation

## 2024-01-02 DIAGNOSIS — S4992XA Unspecified injury of left shoulder and upper arm, initial encounter: Secondary | ICD-10-CM | POA: Diagnosis not present

## 2024-01-02 NOTE — ED Triage Notes (Signed)
 Pt to ed from home via POV for a fall and landed on her left shoulder. Pt tripped and fell. Pt denies hitting her head. Pt has pain in left shoulder.

## 2024-01-03 ENCOUNTER — Telehealth: Payer: Self-pay | Admitting: Emergency Medicine

## 2024-01-03 ENCOUNTER — Emergency Department: Admission: EM | Admit: 2024-01-03 | Discharge: 2024-01-03 | Disposition: A | Discharge status: Home / Self Care

## 2024-01-03 DIAGNOSIS — S42202A Unspecified fracture of upper end of left humerus, initial encounter for closed fracture: Secondary | ICD-10-CM

## 2024-01-03 MED ORDER — ACETAMINOPHEN 500 MG PO TABS: 1000.0000 mg | ORAL_TABLET | Freq: Four times a day (QID) | ORAL | 2 refills | Status: AC | PRN

## 2024-01-03 MED ORDER — OXYCODONE HCL 5 MG PO TABS: 5.0000 mg | ORAL_TABLET | Freq: Three times a day (TID) | ORAL | 0 refills | Status: DC | PRN

## 2024-01-03 MED ORDER — ONDANSETRON 4 MG PO TBDP: 4.0000 mg | ORAL_TABLET | Freq: Three times a day (TID) | ORAL | 0 refills | Status: DC | PRN

## 2024-01-03 MED ORDER — ONDANSETRON 4 MG PO TBDP
4.0000 mg | ORAL_TABLET | Freq: Once | ORAL | Status: AC
  Administered 2024-01-03: 4 mg via ORAL
  Filled 2024-01-03: qty 1

## 2024-01-03 MED ORDER — ACETAMINOPHEN 500 MG PO TABS
1000.0000 mg | ORAL_TABLET | Freq: Once | ORAL | Status: AC
  Administered 2024-01-03: 1000 mg via ORAL
  Filled 2024-01-03: qty 2

## 2024-01-03 MED ORDER — IBUPROFEN 400 MG PO TABS
600.0000 mg | ORAL_TABLET | Freq: Once | ORAL | Status: AC
  Administered 2024-01-03: 600 mg via ORAL
  Filled 2024-01-03: qty 2

## 2024-01-03 MED ORDER — OXYCODONE HCL 5 MG PO TABS
5.0000 mg | ORAL_TABLET | Freq: Once | ORAL | Status: AC
  Administered 2024-01-03: 5 mg via ORAL
  Filled 2024-01-03: qty 1

## 2024-01-03 MED ORDER — IBUPROFEN 200 MG PO TABS: 600.0000 mg | ORAL_TABLET | Freq: Three times a day (TID) | ORAL | 2 refills | Status: DC | PRN

## 2024-01-03 NOTE — Discharge Instructions (Signed)
 Your evaluation in the emergency department was notable for a likely fracture of your left upper arm.  This was placed in a sling, and you have been prescribed pain medications.  I did place a referral for you to follow-up with an orthopedic surgeon--they will contact you to schedule an appointment.  Please follow-up with your primary care provider in addition.  Return to the emergency department with any new or worsening symptoms.

## 2024-01-03 NOTE — Telephone Encounter (Signed)
 Medication needed to be sent to new pharmacy.

## 2024-01-03 NOTE — ED Notes (Signed)
 Notified by [facility] that they would not be filling any of her rxs due to a conduct issue and are refusing service.  Said patient should be reaching out to me soon.  They will cancel the oxy due to not transferable.  They will leave the non controlled in their system, so they can be transferred to another pharmacy.

## 2024-01-03 NOTE — ED Provider Notes (Signed)
 [facility] Provider Note    Event Date/Time   First MD Initiated Contact with Patient 01/03/24 [phone]     (approximate)   History   Shoulder Injury (LEFT)  Pt to ed from home via POV for a fall and landed on her left shoulder. Pt tripped and fell. Pt denies hitting her head. Pt has pain in left shoulder.    HPI Brittany Wood is a 68 y.o. female PMH hyperlipidemia, DM2 presents for evaluation of left shoulder pain after fall -No head strike, no LOC - Slipped off puddle of water in her kitchen.  Broke her fall directly with her left shoulder.  No head strike or LOC, no subsequent vomiting.  No pain elsewhere beyond left shoulder.  Not on blood thinners. -Right-hand-dominant     Physical Exam   Triage Vital Signs: ED Triage Vitals  Encounter Vitals Group     BP 01/02/24 2155 136/80     Girls Systolic BP Percentile --      Girls Diastolic BP Percentile --      Boys Systolic BP Percentile --      Boys Diastolic BP Percentile --      Pulse Rate 01/02/24 2155 64     Resp 01/02/24 2155 18     Temp 01/02/24 2155 98.3 F (36.8 C)     Temp Source 01/02/24 2155 Oral     SpO2 01/02/24 2155 99 %     Weight 01/02/24 2154 180 lb (81.6 kg)     Height 01/02/24 2154 5' 8 (1.727 m)     Head Circumference --      Peak Flow --      Pain Score 01/02/24 2158 10     Pain Loc --      Pain Education --      Exclude from Growth Chart --     Most recent vital signs: Vitals:   01/02/24 2155 01/03/24 0310  BP: 136/80 118/60  Pulse: 64 80  Resp: 18 18  Temp: 98.3 F (36.8 C) 98.9 F (37.2 C)  SpO2: 99% 100%     General: Awake, no distress.  HEENT: Normocephalic, atraumatic CV:  Good peripheral perfusion. RRR, RP 2+ Resp:  Normal effort. CTAB Abd:  No distention. Nontender to deep palpation throughout LUE:  Tender palpation over proximal humerus, no skin tenting or obvious deformity, no open wounds.  No tenderness in elbow, wrist, hand.  RP 2+.   Radian/median/ulnar motor intact.   ED Results / Procedures / Treatments   Labs (all labs ordered are listed, but only abnormal results are displayed) Labs Reviewed - No data to display   EKG  N/a   RADIOLOGY Radiology interpreted myself and radiology report reviewed.  Suspicious for left humeral neck fracture.    PROCEDURES:  Critical Care performed: No  Procedures   MEDICATIONS ORDERED IN ED: Medications  acetaminophen  (TYLENOL ) tablet 1,000 mg (1,000 mg Oral Given 01/03/24 0358)  ibuprofen  (ADVIL ) tablet 600 mg (600 mg Oral Given 01/03/24 0357)  oxyCODONE  (Oxy IR/ROXICODONE ) immediate release tablet 5 mg (5 mg Oral Given 01/03/24 0358)  ondansetron  (ZOFRAN -ODT) disintegrating tablet 4 mg (4 mg Oral Given 01/03/24 0356)     IMPRESSION / MDM / ASSESSMENT AND PLAN / ED COURSE  I reviewed the triage vital signs and the nursing notes.  DDX/MDM/AP: Differential diagnosis includes, but is not limited to, shoulder fracture versus sprain.  Left upper extremity neurovascularly intact.  No evidence of head trauma or neck trauma.  No evidence of other extremity injury.  Patient gives mechanical history for fall, no concern for underlying organic precipitant.  Plan: - X-ray left shoulder - Pain control  Patient's presentation is most consistent with acute complicated illness / injury requiring diagnostic workup.   ED course below.  X-ray concerning for left humeral neck fracture.  Placed in sling, pain control provided, orthopedic referral placed.  Discharged home with Tylenol , Motrin , oxycodone , Zofran .  Plan for outpatient follow-up with orthopedics and PMD.  ED return precautions in place.  Patient agrees with plan.  Clinical Course as of 01/03/24 0411  Mon Jan 03, 2024  0325 XR L shoulder: IMPRESSION: Findings suspicious for an acute mildly displaced left humeral neck fracture with possible displaced greater tuberosity fracture fragment    [MM]    Clinical Course User Index [MM] Clarine Ozell LABOR, MD     FINAL CLINICAL IMPRESSION(S) / ED DIAGNOSES   Final diagnoses:  Closed fracture of proximal end of left humerus, unspecified fracture morphology, initial encounter     Rx / DC Orders   ED Discharge Orders          Ordered    Ambulatory referral to Orthopedic Surgery        01/03/24 0403    acetaminophen  (TYLENOL ) 500 MG tablet  Every 6 hours PRN        01/03/24 0403    ibuprofen  (MOTRIN  IB) 200 MG tablet  Every 8 hours PRN        01/03/24 0403    oxyCODONE  (ROXICODONE ) 5 MG immediate release tablet  Every 8 hours PRN        01/03/24 0403    ondansetron  (ZOFRAN -ODT) 4 MG disintegrating tablet  Every 8 hours PRN        01/03/24 0403             Note:  This document was prepared using Dragon voice recognition software and may include unintentional dictation errors.   Clarine Ozell LABOR, MD 01/03/24 [phone]

## 2024-01-04 DIAGNOSIS — S42292A Other displaced fracture of upper end of left humerus, initial encounter for closed fracture: Secondary | ICD-10-CM | POA: Diagnosis not present

## 2024-01-05 ENCOUNTER — Encounter: Payer: Self-pay | Admitting: Orthopaedic Surgery

## 2024-01-05 ENCOUNTER — Ambulatory Visit: Admitting: Orthopaedic Surgery

## 2024-01-05 DIAGNOSIS — S42202A Unspecified fracture of upper end of left humerus, initial encounter for closed fracture: Secondary | ICD-10-CM | POA: Insufficient documentation

## 2024-01-05 DIAGNOSIS — S42292A Other displaced fracture of upper end of left humerus, initial encounter for closed fracture: Secondary | ICD-10-CM

## 2024-01-05 MED ORDER — OXYCODONE HCL 5 MG PO TABS: 5.0000 mg | ORAL_TABLET | Freq: Three times a day (TID) | ORAL | 0 refills | Status: DC | PRN

## 2024-01-05 MED ORDER — ONDANSETRON 4 MG PO TBDP: 4.0000 mg | ORAL_TABLET | Freq: Three times a day (TID) | ORAL | 0 refills | Status: AC | PRN

## 2024-01-05 NOTE — Progress Notes (Signed)
 Office Visit Note   Patient: Brittany Wood           Date of Birth: Nov 02, 1955           MRN: [id number] Visit Date: 01/05/2024              Requested by: Clarine Ozell DELENA, MD [address] [place],  [place] [address] PCP: Corwin Antu, FNP   Assessment & Plan: Visit Diagnoses:  1. Other closed displaced fracture of proximal end of left humerus, initial encounter     Plan: History of Present Illness The patient presents with left proximal humerus fracture from 01/03/24.  She is an ED follow up.  The pain from the proximal humerus fracture is described as the worst they have ever experienced, preventing bathing and disrupting sleep, often requiring them to sleep upright or in a recliner. Significant swelling is present in the arm, with bruising extending into the shoulder, armpit, and breast area. Swelling and pain worsen when leaning over or lying down.  They use a sling for arm immobilization, having found the hospital-provided splint inadequate, and express concern about potential elbow stiffness from prolonged use.  Current medications include oxycodone  for pain management and Zofran . They have requested refills for these medications. Sensation is intact, as they can feel touch on their shoulder and fingers.  Physical Exam EXTREMITIES: Bruising on arm, normal appearance. NEUROLOGICAL: Sensation intact on shoulder and fingers.  Axillary nerve intact.  Results RADIOLOGY Proximal humerus x-ray: Alignment satisfactory.  Assessment and Plan Left proximal humerus fracture Fracture aligned well, should do well with nonoperative treatment. Conservative management expected to heal with some range of motion loss. Bruising and swelling normal. Pain due to swelling and tissue shifting. - Immobilize arm in sling for three weeks. - Advise gentle shoulder movement within pain limits after three weeks. - Refill Zofran  and oxycodone  prescriptions, send to [facility] in [place]. - Educate  on preventing elbow stiffness by moving elbow occasionally. - Schedule follow-up in three weeks for re-evaluation and x-rays.  Follow-Up Instructions: Return in about 3 weeks (around 01/26/2024).   Orders:  No orders of the defined types were placed in this encounter.  Meds ordered this encounter  Medications   oxyCODONE  (ROXICODONE ) 5 MG immediate release tablet    Sig: Take 1 tablet (5 mg total) by mouth every 8 (eight) hours as needed.    Dispense:  30 tablet    Refill:  0   ondansetron  (ZOFRAN -ODT) 4 MG disintegrating tablet    Sig: Take 1 tablet (4 mg total) by mouth every 8 (eight) hours as needed for nausea or vomiting.    Dispense:  30 tablet    Refill:  0      Procedures: No procedures performed   Clinical Data: No additional findings.   Subjective: Chief Complaint  Patient presents with   Left Shoulder - Injury    DOI 01/03/2024    HPI  Review of Systems  Constitutional: Negative.   HENT: Negative.    Eyes: Negative.   Respiratory: Negative.    Cardiovascular: Negative.   Endocrine: Negative.   Musculoskeletal: Negative.   Neurological: Negative.   Hematological: Negative.   Psychiatric/Behavioral: Negative.    All other systems reviewed and are negative.    Objective: Vital Signs: There were no vitals taken for this visit.  Physical Exam Vitals and nursing note reviewed.  Constitutional:      Appearance: She is well-developed.  HENT:     Head: Atraumatic.  Nose: Nose normal.  Eyes:     Extraocular Movements: Extraocular movements intact.  Cardiovascular:     Pulses: Normal pulses.  Pulmonary:     Effort: Pulmonary effort is normal.  Abdominal:     Palpations: Abdomen is soft.  Musculoskeletal:     Cervical back: Neck supple.  Skin:    General: Skin is warm.     Capillary Refill: Capillary refill takes less than 2 seconds.  Neurological:     Mental Status: She is alert. Mental status is at baseline.  Psychiatric:         Behavior: Behavior normal.        Thought Content: Thought content normal.        Judgment: Judgment normal.     Ortho Exam  Specialty Comments:  No specialty comments available.  Imaging: No results found.   PMFS History: Patient Active Problem List   Diagnosis Date Noted   Closed fracture of left proximal humerus 01/05/2024   Vitamin D  deficiency 12/11/2022   Oral phase dysphagia 12/11/2022   Hyperlipidemia associated with type 2 diabetes mellitus (HCC) 12/11/2022   Obesity (BMI 30-39.9) 12/11/2022   History of obstructive sleep apnea 12/11/2022   Anxiety with depression 12/11/2022   Irregular heart rhythm 12/10/2022   Cholelithiasis without obstruction 11/19/2017   Depression 11/19/2017   Hypothyroidism 11/19/2017   Diabetes mellitus type 2, uncomplicated (HCC) 11/19/2017   Steatosis of liver 11/19/2017   Radial styloid tenosynovitis 09/29/2017   Past Medical History:  Diagnosis Date   Diabetes mellitus without complication (HCC)    Thyroid  disease     Family History  Problem Relation Age of Onset   Ovarian cancer Mother    Breast cancer Maternal Aunt        mat great aunt   Heart failure Father    COPD Father    Cancer Maternal Grandfather    Heart attack Paternal Grandmother    COPD Paternal Grandfather     Past Surgical History:  Procedure Laterality Date   LAPAROSCOPIC GASTRIC SLEEVE RESECTION  01/2015   REDUCTION MAMMAPLASTY Bilateral 1983   scars around nipples and under breast from the surgery   TOTAL VAGINAL HYSTERECTOMY     Social History   Occupational History   Occupation: retired    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

## 2024-01-13 ENCOUNTER — Telehealth: Payer: Self-pay | Admitting: Orthopaedic Surgery

## 2024-01-13 ENCOUNTER — Other Ambulatory Visit: Payer: Self-pay | Admitting: Physician Assistant

## 2024-01-13 MED ORDER — HYDROCODONE-ACETAMINOPHEN 5-325 MG PO TABS: 1.0000 | ORAL_TABLET | Freq: Three times a day (TID) | ORAL | 0 refills | Status: DC | PRN

## 2024-01-13 NOTE — Telephone Encounter (Signed)
 Weaning to norco and sent to pharmacy

## 2024-01-13 NOTE — Telephone Encounter (Signed)
 Pt called requesting a refill of oxycodone . Please send to [facility] [address] . Pt phone number is [phone].

## 2024-01-13 NOTE — Telephone Encounter (Signed)
 Called and let patient know

## 2024-01-25 ENCOUNTER — Other Ambulatory Visit (INDEPENDENT_AMBULATORY_CARE_PROVIDER_SITE_OTHER): Payer: Self-pay

## 2024-01-25 ENCOUNTER — Ambulatory Visit: Admitting: Orthopaedic Surgery

## 2024-01-25 ENCOUNTER — Other Ambulatory Visit: Payer: Self-pay | Admitting: Orthopaedic Surgery

## 2024-01-25 DIAGNOSIS — S42292A Other displaced fracture of upper end of left humerus, initial encounter for closed fracture: Secondary | ICD-10-CM

## 2024-01-25 MED ORDER — HYDROCODONE-ACETAMINOPHEN 5-325 MG PO TABS
1.0000 | ORAL_TABLET | Freq: Every day | ORAL | 0 refills | Status: DC | PRN
Start: 2024-01-25 — End: 2024-02-09

## 2024-01-25 NOTE — Progress Notes (Signed)
 Patient: Brittany Wood           Date of Birth: [birth year]           MRN: [id number] Visit Date: 01/25/2024 PCP: Corwin Antu, FNP   Assessment & Plan:  Chief Complaint:  Chief Complaint  Patient presents with   Left Shoulder - Follow-up   Visit Diagnoses:  1. Other closed displaced fracture of proximal end of left humerus, initial encounter     Plan: History of Present Illness Brittany Wood is a 68 year old female who presents for follow-up of a shoulder injury sustained three weeks ago.  Overall she is feeling much better.  She experiences increased shoulder pain today, which she associates with driving and not wearing a sling. The pain is localized to the shoulder region. This outing is the most significant activity she has engaged in since the injury.  She can partially raise her arm but cannot achieve full range of motion, particularly in raising her arm completely. She is likely engaging in more activities than recommended, as she lives in a community with puppies and manages her activities accordingly.  She is currently using hydrocodone  for pain management.  Range of motion of the shoulder is progressing.  Results RADIOLOGY Shoulder X-ray: Healing, consolidating fracture with early bridging (01/04/2024)  Assessment and Plan Right proximal humerus fracture Three weeks post-injury with significant improvement. X-rays show appropriate healing. Pain persists due to increased activity and lack of sling use. - Advise gentle range of motion exercises: wall crawls, door handle rotations. - Instruct to avoid pushing through pain, work within pain limits for three weeks. - Prescribe additional week of hydrocodone  for pain management. - Schedule follow-up in three weeks with repeat x-rays. - Plan referral to physical therapy if x-rays show sufficient healing.  Follow-Up Instructions: Return in about 3 weeks (around 02/15/2024).   Orders:  No orders of the  defined types were placed in this encounter.  Meds ordered this encounter  Medications   HYDROcodone -acetaminophen  (NORCO/VICODIN) 5-325 MG tablet    Sig: Take 1 tablet by mouth daily as needed for moderate pain (pain score 4-6).    Dispense:  7 tablet    Refill:  0    Imaging: XR Shoulder Left Result Date: 01/25/2024 Xrays show appropriate amount of fracture healing to the proximal humerus with callus formation.    PMFS History: Patient Active Problem List   Diagnosis Date Noted   Closed fracture of left proximal humerus 01/05/2024   Vitamin D  deficiency 12/11/2022   Oral phase dysphagia 12/11/2022   Hyperlipidemia associated with type 2 diabetes mellitus (HCC) 12/11/2022   Obesity (BMI 30-39.9) 12/11/2022   History of obstructive sleep apnea 12/11/2022   Anxiety with depression 12/11/2022   Irregular heart rhythm 12/10/2022   Cholelithiasis without obstruction 11/19/2017   Depression 11/19/2017   Hypothyroidism 11/19/2017   Diabetes mellitus type 2, uncomplicated (HCC) 11/19/2017   Steatosis of liver 11/19/2017   Radial styloid tenosynovitis 09/29/2017   Past Medical History:  Diagnosis Date   Diabetes mellitus without complication (HCC)    Thyroid  disease     Family History  Problem Relation Age of Onset   Ovarian cancer Mother    Breast cancer Maternal Aunt        mat great aunt   Heart failure Father    COPD Father    Cancer Maternal Grandfather    Heart attack Paternal Grandmother    COPD Paternal Grandfather  Past Surgical History:  Procedure Laterality Date   LAPAROSCOPIC GASTRIC SLEEVE RESECTION  01/2015   REDUCTION MAMMAPLASTY Bilateral 1983   scars around nipples and under breast from the surgery   TOTAL VAGINAL HYSTERECTOMY     Social History   Occupational History   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

## 2024-02-09 ENCOUNTER — Ambulatory Visit: Admitting: Family

## 2024-02-09 ENCOUNTER — Encounter: Payer: Self-pay | Admitting: Family

## 2024-02-09 VITALS — BP 102/68 | HR 77 | Temp 98.0°F | Wt 169.4 lb

## 2024-02-09 DIAGNOSIS — R82998 Other abnormal findings in urine: Secondary | ICD-10-CM

## 2024-02-09 DIAGNOSIS — N3001 Acute cystitis with hematuria: Secondary | ICD-10-CM | POA: Insufficient documentation

## 2024-02-09 DIAGNOSIS — R300 Dysuria: Secondary | ICD-10-CM | POA: Diagnosis not present

## 2024-02-09 DIAGNOSIS — S42292A Other displaced fracture of upper end of left humerus, initial encounter for closed fracture: Secondary | ICD-10-CM | POA: Diagnosis not present

## 2024-02-09 LAB — POCT URINE DIPSTICK
Glucose, UA: NEGATIVE mg/dL
Nitrite, UA: POSITIVE — AB
Spec Grav, UA: 1.02 (ref 1.010–1.025)
Urobilinogen, UA: 2 U/dL — AB
pH, UA: 5 (ref 5.0–8.0)

## 2024-02-09 MED ORDER — IBUPROFEN 600 MG PO TABS: 600.0000 mg | ORAL_TABLET | Freq: Three times a day (TID) | ORAL | 0 refills | Status: AC | PRN

## 2024-02-09 MED ORDER — SULFAMETHOXAZOLE-TRIMETHOPRIM 800-160 MG PO TABS: 1.0000 | ORAL_TABLET | Freq: Two times a day (BID) | ORAL | 0 refills | Status: AC

## 2024-02-09 NOTE — Assessment & Plan Note (Signed)
 Cont f/u with orthopedist as scheduled Continue with recommendations

## 2024-02-09 NOTE — Assessment & Plan Note (Signed)
poct urine dip in office Urine culture ordered pending results antbx sent to pharmacy, pt to take as directed. Encouraged increased water intake throughout the day. Choosing to treat due to being symptomatic. If no improvement in the next 2 days pt advised to let me know.  Rx bactrim 800/160 mg po bid x 7 days.

## 2024-02-09 NOTE — Progress Notes (Signed)
 Established Patient Office Visit  Subjective:      CC:  Chief Complaint  Patient presents with   Follow-up    ED follow up 01/03/24    HPI: Brittany Wood is a 68 y.o. female presenting on 02/09/2024 for Follow-up (ED follow up 01/03/24) .  Discussed the use of AI scribe software for clinical note transcription with the patient, who did not give verbal consent to proceed.  History of Present Illness   Started recently about two weeks ago with urinary frequency, blood visualized in the urine, dysuria and urgency. This am woke up with some left lower back pain and also with some lower pelvic pain, last weekend with fever/chills which have resolved.   Went to ER 7/7 for a fall where she landed on her left shoulder after triping and falling. She did not hit her head. Xray left shoulder with acute mildly displaced left humeral neck fracutre with possible displaced greater tuberosity fracture fragment. Was referred to ortho for stat consult with Dr. Jerri , per note discussed non-operative treatment , conservative measures followed with instructions to immobilize arm in sling for three weeks, gentle shoulder movement after three weeks, given zofran  and oxycodone  rx, and was scheduled to f/u three weeks for re eval and xray. Repeat xray 7/29 with fracture to the proximal humerus with callus formation. Per note with dr. Jerri 7/29 advised to schedule 3 week f/u up with plan for physical therapy if xrays with sufficient healing. Prescribed addtl hydrocodone  for pain management.  No longer using opioids for pain management, however pain is still there much less from prior. Where it was a 10/10 it is not after moving around for the day it will be 4/10.. tylenol  isn't helping her a whole much. She does have some radiation of pain across her chest along the upper shoulder to the right shoulder. No burning nerve pain.    Wt Readings from Last 3 Encounters:  02/09/24 169 lb 6.4 oz (76.8 kg)  01/02/24 180  lb (81.6 kg)  04/01/23 214 lb (97.1 kg)           Social history:  Relevant past medical, surgical, family and social history reviewed and updated as indicated. Interim medical history since our last visit reviewed.  Allergies and medications reviewed and updated.  DATA REVIEWED: CHART IN EPIC     ROS: Negative unless specifically indicated above in HPI.    Current Outpatient Medications:    acetaminophen  (TYLENOL ) 500 MG tablet, Take 2 tablets (1,000 mg total) by mouth every 6 (six) hours as needed., Disp: 300 tablet, Rfl: 2   cholecalciferol  (VITAMIN D ) 1000 units tablet, Take 1,000 Units by mouth daily., Disp: , Rfl:    escitalopram  (LEXAPRO ) 20 MG tablet, Take 1 tablet (20 mg total) by mouth daily., Disp: 90 tablet, Rfl: 3   furosemide  (LASIX ) 20 MG tablet, Take 1 tablet (20 mg total) by mouth daily as needed., Disp: 10 tablet, Rfl: 2   ibuprofen  (ADVIL ) 600 MG tablet, Take 1 tablet (600 mg total) by mouth every 8 (eight) hours as needed., Disp: 30 tablet, Rfl: 0   levothyroxine  (SYNTHROID ) 150 MCG tablet, Take 1 tablet (150 mcg total) by mouth daily. Will need to be seen in office for more refills., Disp: 90 tablet, Rfl: 0   metFORMIN  (GLUCOPHAGE ) 500 MG tablet, Take 1 tablet (500 mg total) by mouth 2 (two) times daily with a meal., Disp: 180 tablet, Rfl: 3   ondansetron  (ZOFRAN -ODT) 4 MG disintegrating  tablet, Take 1 tablet (4 mg total) by mouth every 8 (eight) hours as needed for nausea or vomiting., Disp: 30 tablet, Rfl: 0   sitaGLIPtin  (JANUVIA ) 25 MG tablet, Take 1 tablet (25 mg total) by mouth daily., Disp: 90 tablet, Rfl: 3   sulfamethoxazole -trimethoprim  (BACTRIM  DS) 800-160 MG tablet, Take 1 tablet by mouth 2 (two) times daily for 7 days., Disp: 14 tablet, Rfl: 0        Objective:        BP 102/68 (BP Location: Right Arm, Patient Position: Sitting, Cuff Size: Normal)   Pulse 77   Temp 98 F (36.7 C) (Temporal)   Wt 169 lb 6.4 oz (76.8 kg)   SpO2 98%    BMI 25.76 kg/m   Physical Exam   Wt Readings from Last 3 Encounters:  02/09/24 169 lb 6.4 oz (76.8 kg)  01/02/24 180 lb (81.6 kg)  04/01/23 214 lb (97.1 kg)    Physical Exam Constitutional:      General: She is not in acute distress.    Appearance: Normal appearance. She is normal weight. She is not ill-appearing, toxic-appearing or diaphoretic.  Cardiovascular:     Rate and Rhythm: Normal rate and regular rhythm.  Pulmonary:     Effort: Pulmonary effort is normal.  Abdominal:     General: Abdomen is flat.     Tenderness: There is no abdominal tenderness. There is no right CVA tenderness or left CVA tenderness.  Neurological:     General: No focal deficit present.     Mental Status: She is alert and oriented to person, place, and time. Mental status is at baseline.     Motor: No weakness.  Psychiatric:        Mood and Affect: Mood normal.        Behavior: Behavior normal.        Thought Content: Thought content normal.        Judgment: Judgment normal.          Results   Assessment & Plan:   Assessment and Plan Assessment & Plan    Burning with urination -     POCT URINE DIPSTICK  Leukocytes in urine -     Urine Culture  Other closed displaced fracture of proximal end of left humerus, initial encounter Assessment & Plan: Cont f/u with orthopedist as scheduled Continue with recommendations   Orders: -     Ibuprofen ; Take 1 tablet (600 mg total) by mouth every 8 (eight) hours as needed.  Dispense: 30 tablet; Refill: 0  Acute cystitis with hematuria Assessment & Plan: poct urine dip in office Urine culture ordered pending results antbx sent to pharmacy, pt to take as directed. Encouraged increased water intake throughout the day. Choosing to treat due to being symptomatic. If no improvement in the next 2 days pt advised to let me know.  Rx bactrim  800/160 mg po bid x 7 days   Orders: -     Sulfamethoxazole -Trimethoprim ; Take 1 tablet by mouth 2  (two) times daily for 7 days.  Dispense: 14 tablet; Refill: 0       Return in about 3 months (around 05/11/2024) for f/u CPE.     Ginger Patrick, MSN, APRN, FNP-C [facility]

## 2024-02-10 LAB — URINE CULTURE
MICRO NUMBER:: 16826068
SPECIMEN QUALITY:: ADEQUATE

## 2024-02-14 ENCOUNTER — Ambulatory Visit: Payer: Self-pay | Admitting: Family

## 2024-02-22 ENCOUNTER — Ambulatory Visit: Admitting: Orthopaedic Surgery

## 2024-03-20 ENCOUNTER — Other Ambulatory Visit: Payer: Self-pay | Admitting: *Deleted

## 2024-03-20 DIAGNOSIS — E039 Hypothyroidism, unspecified: Secondary | ICD-10-CM

## 2024-03-20 MED ORDER — LEVOTHYROXINE SODIUM 150 MCG PO TABS: 150.0000 ug | ORAL_TABLET | Freq: Every day | ORAL | 0 refills | Status: DC

## 2024-03-31 ENCOUNTER — Other Ambulatory Visit: Payer: Self-pay | Admitting: *Deleted

## 2024-03-31 DIAGNOSIS — E119 Type 2 diabetes mellitus without complications: Secondary | ICD-10-CM

## 2024-03-31 MED ORDER — METFORMIN HCL 500 MG PO TABS: 500.0000 mg | ORAL_TABLET | Freq: Two times a day (BID) | ORAL | 0 refills | Status: DC

## 2024-04-04 ENCOUNTER — Other Ambulatory Visit: Payer: Self-pay | Admitting: Family

## 2024-04-04 DIAGNOSIS — F418 Other specified anxiety disorders: Secondary | ICD-10-CM

## 2024-04-04 DIAGNOSIS — E039 Hypothyroidism, unspecified: Secondary | ICD-10-CM

## 2024-04-04 MED ORDER — ESCITALOPRAM OXALATE 20 MG PO TABS: 20.0000 mg | ORAL_TABLET | Freq: Every day | ORAL | 0 refills | Status: DC

## 2024-04-04 MED ORDER — LEVOTHYROXINE SODIUM 150 MCG PO TABS: 150.0000 ug | ORAL_TABLET | Freq: Every day | ORAL | 0 refills | Status: DC

## 2024-04-04 NOTE — Telephone Encounter (Signed)
 Copied from CRM [id number]. Topic: Clinical - Medication Refill >> Apr 04, 2024 12:44 PM Mesmerise C wrote: Medication:  escitalopram  (LEXAPRO ) 20 MG tablet levothyroxine  (SYNTHROID ) 150 MCG tablet  Has the patient contacted their pharmacy? Yes (Agent: If no, request that the patient contact the pharmacy for the refill. If patient does not wish to contact the pharmacy document the reason why and proceed with request.) (Agent: If yes, when and what did the pharmacy advise?) They're refilling the Levothyroxine  but it's the last refill and she's completely out of her lexapro  This is the patient's preferred pharmacy:  [id number] [address], [place] - [address] AT [facility] OF [address] [place] [address] Phone: [phone] Fax: [phone]  Is this the correct pharmacy for this prescription? Yes If no, delete pharmacy and type the correct one.   Has the prescription been filled recently? No  Is the patient out of the medication? Yes  Has the patient been seen for an appointment in the last year OR does the patient have an upcoming appointment? Yes  Can we respond through MyChart? Yes  Agent: Please be advised that Rx refills may take up to 3 business days. We ask that you follow-up with your pharmacy.

## 2024-04-05 ENCOUNTER — Telehealth: Payer: Self-pay | Admitting: Family

## 2024-04-05 NOTE — Telephone Encounter (Signed)
 Form has been filled out and faxed back to [facility].

## 2024-04-05 NOTE — Telephone Encounter (Signed)
 Patient dropped off form related to chronic condition needing to be completed and faxed to insurance placed in providers box at front desk

## 2024-05-01 ENCOUNTER — Encounter: Payer: Self-pay | Admitting: Radiology

## 2024-06-23 ENCOUNTER — Telehealth: Payer: Self-pay | Admitting: Family

## 2024-06-23 ENCOUNTER — Ambulatory Visit

## 2024-06-23 NOTE — Telephone Encounter (Signed)
 Lvm to reschedule appt that patient missed today  Copied from CRM #[id number]. Topic: Appointments - Scheduling Inquiry for Clinic >> Jun 23, 2024  3:16 PM Victoria A wrote: Reason for CRM: Patient called to reschedule AWV due to missing appointment today. Book It would not allow agent to schedule AWV-please assist and call patient Home [phone]

## 2024-06-30 ENCOUNTER — Other Ambulatory Visit: Payer: Self-pay | Admitting: *Deleted

## 2024-06-30 DIAGNOSIS — F418 Other specified anxiety disorders: Secondary | ICD-10-CM

## 2024-07-03 ENCOUNTER — Ambulatory Visit (INDEPENDENT_AMBULATORY_CARE_PROVIDER_SITE_OTHER)

## 2024-07-03 VITALS — Ht 68.0 in | Wt 169.0 lb

## 2024-07-03 DIAGNOSIS — Z0000 Encounter for general adult medical examination without abnormal findings: Secondary | ICD-10-CM | POA: Diagnosis not present

## 2024-07-03 DIAGNOSIS — F418 Other specified anxiety disorders: Secondary | ICD-10-CM

## 2024-07-03 MED ORDER — ESCITALOPRAM OXALATE 20 MG PO TABS: 20.0000 mg | ORAL_TABLET | Freq: Every day | ORAL | 0 refills | Status: DC

## 2024-07-03 NOTE — Telephone Encounter (Signed)
 As you also advised her  We did an ER f/u and went over UTI symptoms we did not address her chronic health concerns  We do not have labs in over x one year and last A1c her diabetes was not at goal, she is overdue. I will send in 30 days of lexapro  but she is overdue for a f/u (was written last appt to f/u 11/25)

## 2024-07-03 NOTE — Progress Notes (Signed)
 "  Chief Complaint  Patient presents with   Medicare Wellness     Subjective:  Please attest and cosign this visit due to patients primary care provider not being in the office at the time the visit was completed.  (Pt of Ginger Patrick, NP)   Brittany Wood is a 69 y.o. female who presents for a Medicare Annual Wellness Visit.  Visit info / Clinical Intake: Medicare Wellness Visit Type:: Initial Annual Wellness Visit Persons participating in visit and providing information:: patient Medicare Wellness Visit Mode:: Telephone If telephone:: video declined Since this visit was completed virtually, some vitals may be partially provided or unavailable. Missing vitals are due to the limitations of the virtual format.: Unable to obtain vitals - no equipment If Telephone or Video please confirm:: I connected with patient using audio/video enable telemedicine. I verified patient identity with two identifiers, discussed telehealth limitations, and patient agreed to proceed. Patient Location:: Home Provider Location:: Office Interpreter Needed?: No Pre-visit prep was completed: yes AWV questionnaire completed by patient prior to visit?: no Living arrangements:: (!) lives alone Patient's Overall Health Status Rating: very good Typical amount of pain: none Does pain affect daily life?: no Are you currently prescribed opioids?: no  Dietary Habits and Nutritional Risks How many meals a day?: 3 Eats fruit and vegetables daily?: yes Most meals are obtained by: preparing own meals; eating out In the last 2 weeks, have you had any of the following?: none Diabetic:: (!) yes Any non-healing wounds?: no How often do you check your BS?: as needed Would you like to be referred to a Nutritionist or for Diabetic Management? : no  Functional Status Activities of Daily Living (to include ambulation/medication): Independent Ambulation: Independent with device- listed below Home Assistive  Devices/Equipment: Eyeglasses Medication Administration: Independent Home Management (perform basic housework or laundry): Independent Manage your own finances?: yes Primary transportation is: driving Concerns about vision?: no *vision screening is required for WTM* Concerns about hearing?: no  Fall Screening Falls in the past year?: 1 Number of falls in past year: 0 (1) Was there an injury with Fall?: 1 (right shouder fracture) Fall Risk Category Calculator: 2 Patient Fall Risk Level: Moderate Fall Risk  Fall Risk Patient at Risk for Falls Due to: Other (Comment) (1 fall) Fall risk Follow up: Falls evaluation completed; Falls prevention discussed  Home and Transportation Safety: All rugs have non-skid backing?: N/A, no rugs All stairs or steps have railings?: N/A, no stairs Grab bars in the bathtub or shower?: (!) no Have non-skid surface in bathtub or shower?: yes Good home lighting?: yes Regular seat belt use?: yes Hospital stays in the last year:: no  Cognitive Assessment Difficulty concentrating, remembering, or making decisions? : no Will 6CIT or Mini Cog be Completed: yes (pt declined to complete the last 2 sections but is alert and aware) What year is it?: 0 points What month is it?: 0 points Give patient an address phrase to remember (5 components): [address] [place], [place] About what time is it?: 0 points Count backwards from 20 to 1: 0 points Say the months of the year in reverse: 4 points (pt DECLINED to complete this section) Repeat the address phrase from earlier: 10 points (pt DECLINED to complete this section) 6 CIT Score: 14 points  Advance Directives (For Healthcare) Does Patient Have a Medical Advance Directive?: No Would patient like information on creating a medical advance directive?: No - Patient declined  Reviewed/Updated  Reviewed/Updated: Reviewed All (Medical, Surgical, Family,  Medications, Allergies, Care Teams, Patient  Goals)    Allergies (verified) Lipitor [atorvastatin ] and Codeine   Current Medications (verified) Outpatient Encounter Medications as of 07/03/2024  Medication Sig   acetaminophen  (TYLENOL ) 500 MG tablet Take 2 tablets (1,000 mg total) by mouth every 6 (six) hours as needed.   cholecalciferol  (VITAMIN D ) 1000 units tablet Take 1,000 Units by mouth daily.   escitalopram  (LEXAPRO ) 20 MG tablet Take 1 tablet (20 mg total) by mouth daily. Take 1 tablet (20 mg total) by mouth daily. MUST HAVE OV FOR FURTHER REFILLS   furosemide  (LASIX ) 20 MG tablet Take 1 tablet (20 mg total) by mouth daily as needed.   ibuprofen  (ADVIL ) 600 MG tablet Take 1 tablet (600 mg total) by mouth every 8 (eight) hours as needed.   levothyroxine  (SYNTHROID ) 150 MCG tablet Take 1 tablet (150 mcg total) by mouth daily. Will need to be seen in office for more refills.   metFORMIN  (GLUCOPHAGE ) 500 MG tablet Take 1 tablet (500 mg total) by mouth 2 (two) times daily with a meal. Take 1 tablet (500 mg total) by mouth 2 (two) times daily with a meal. MUST HAVE OV FOR FURTHER REFILLS   ondansetron  (ZOFRAN -ODT) 4 MG disintegrating tablet Take 1 tablet (4 mg total) by mouth every 8 (eight) hours as needed for nausea or vomiting.   sitaGLIPtin  (JANUVIA ) 25 MG tablet Take 1 tablet (25 mg total) by mouth daily.   No facility-administered encounter medications on file as of 07/03/2024.    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   REDUCTION MAMMAPLASTY Bilateral 1983   scars around nipples and under breast from the surgery   TOTAL VAGINAL HYSTERECTOMY     Family History  Problem Relation Age of Onset   Ovarian cancer Mother    Breast cancer Maternal Aunt        mat great aunt   Heart failure Father    COPD Father    Cancer Maternal Grandfather     Heart attack Paternal Grandmother    COPD Paternal Grandfather    Social History   Occupational History   Occupation: retired    Comment: used to be an [employer]  Tobacco Use   Smoking status: Never   Smokeless tobacco: Never  Vaping Use   Vaping status: Never Used  Substance and Sexual Activity   Alcohol use: Not Currently   Drug use: Never   Sexual activity: Not Currently   Tobacco Counseling Counseling given: No  SDOH Screenings   Food Insecurity: No Food Insecurity (07/03/2024)  Housing: Unknown (07/03/2024)  Transportation Needs: No Transportation Needs (07/03/2024)  Utilities: Not At Risk (07/03/2024)  Depression (PHQ2-9): Low Risk (07/03/2024)  Physical Activity: Sufficiently Active (07/03/2024)  Social Connections: Socially Isolated (07/03/2024)  Stress: No Stress Concern Present (07/03/2024)  Tobacco Use: Low Risk (07/03/2024)  Health Literacy: Adequate Health Literacy (07/03/2024)   See flowsheets for full screening details  Depression Screen PHQ 2 & 9 Depression Scale- Over the past 2 weeks, how often have you been bothered by any of the following problems? Little interest or pleasure in doing things: 0 Feeling down, depressed, or hopeless (PHQ Adolescent also includes...irritable): 0 PHQ-2 Total Score: 0 Trouble falling or staying asleep, or sleeping too much: 0 Feeling tired or having little energy: 0 Poor appetite or overeating (PHQ  Adolescent also includes...weight loss): 0 Feeling bad about yourself - or that you are a failure or have let yourself or your family down: 0 Trouble concentrating on things, such as reading the newspaper or watching television (PHQ Adolescent also includes...like school work): 0 Moving or speaking so slowly that other people could have noticed. Or the opposite - being so fidgety or restless that you have been moving around a lot more than usual: 0 Thoughts that you would be better off dead, or of hurting yourself in some way: 0 PHQ-9 Total Score: 0 If  you checked off any problems, how difficult have these problems made it for you to do your work, take care of things at home, or get along with other people?: Not difficult at all  Depression Treatment Depression Interventions/Treatment : Medication; Currently on Treatment; PHQ2-9 Score <4 Follow-up Not Indicated     Goals Addressed               This Visit's Progress     Patient Stated (pt-stated)        Patient stated she plans to continue to exercising             Objective:    Today's Vitals   07/03/24 1355  Weight: 169 lb (76.7 kg)  Height: 5' 8 (1.727 m)   Body mass index is 25.7 kg/m.  Hearing/Vision screen Hearing Screening - Comments:: Denies hearing difficulties   Vision Screening - Comments:: Wears eyeglasses for reading  Immunizations and Health Maintenance Health Maintenance  Topic Date Due   OPHTHALMOLOGY EXAM  Never done   Colonoscopy  Never done   Diabetic kidney evaluation - Urine ACR  12/24/2018   FOOT EXAM  03/10/2019   Bone Density Scan  Never done   HEMOGLOBIN A1C  09/30/2023   Diabetic kidney evaluation - eGFR measurement  12/10/2023   COVID-19 Vaccine (1 - 2025-26 season) Never done   Influenza Vaccine  09/26/2024 (Originally 01/28/2024)   Zoster Vaccines- Shingrix (1 of 2) 10/01/2024 (Originally 09/03/2007)   Pneumococcal Vaccine: 50+ Years (1 of 2 - PCV) 07/03/2025 (Originally 09/02/1976)   Mammogram  07/03/2025 (Originally 12/10/2020)   Hepatitis C Screening  07/03/2025 (Originally 09/03/1975)   Medicare Annual Wellness (AWV)  07/03/2025   DTaP/Tdap/Td (2 - Td or Tdap) 06/30/2027   Meningococcal B Vaccine  Aged Out        Assessment/Plan:  This is a routine wellness examination for Brittany Wood.  DEXA Scan status: Pt Declined  Colonoscopy status: Pt declined  Mammogram status: Pt declined  Hepatitis C Screening:  Pt declined  Immunizations status: Pt declined the Pneumonia and Shingles vaccines   Patient Care Team: Corwin Antu,  FNP as PCP - General (Family Medicine)  I have personally reviewed and noted the following in the patients chart:   Medical and social history Use of alcohol, tobacco or illicit drugs  Current medications and supplements including opioid prescriptions. Functional ability and status Nutritional status Physical activity Advanced directives List of other physicians Hospitalizations, surgeries, and ER visits in previous 12 months Vitals Screenings to include cognitive, depression, and falls Referrals and appointments  No orders of the defined types were placed in this encounter.  In addition, I have reviewed and discussed with patient certain preventive protocols, quality metrics, and best practice recommendations. A written personalized care plan for preventive services as well as general preventive health recommendations were provided to patient.   Brittany Wood, CMA   07/03/2024   Return in 1  year (on 07/03/2025).  After Visit Summary: (MyChart) Due to this being a telephonic visit, the after visit summary with patients personalized plan was offered to patient via MyChart   Nurse Notes: pt declined to schedule a Diabetes f/u appt w/PCP today.  Pt is requesting a refill on Escitalopram  (Lexapro ).  Msg sent to PCP's nurse to contact the pt.  Pt declined to complete the last 2 6CIT questions.  Pt is aware and alert. "

## 2024-07-03 NOTE — Telephone Encounter (Signed)
 Spoke with pt. She is referring to her lexapro  prescription. Pt was last seen for an acute issue on 02/09/24 and states that everything was addressed at this appointment and there is no need for her come see Tabitha. Pt's last chronic care management appointment was on 04/01/23 and was to follow up in 6 months but she did not.

## 2024-07-03 NOTE — Addendum Note (Signed)
 Addended by: CORWIN ANTU on: 07/03/2024 02:44 PM   Modules accepted: Orders

## 2024-07-03 NOTE — Addendum Note (Signed)
 Addended by: ALBINO SHAVER C on: 07/03/2024 02:14 PM   Modules accepted: Orders

## 2024-07-03 NOTE — Patient Instructions (Addendum)
 Ms. Bloodsworth,  Thank you for taking the time for your Medicare Wellness Visit. I appreciate your continued commitment to your health goals. Please review the care plan we discussed, and feel free to reach out if I can assist you further.  Please note that Annual Wellness Visits do not include a physical exam. Some assessments may be limited, especially if the visit was conducted virtually. If needed, we may recommend an in-person follow-up with your provider.  Ongoing Care Seeing your primary care provider every 3 to 6 months helps us  monitor your health and provide consistent, personalized care.   Referrals If a referral was made during today's visit and you haven't received any updates within two weeks, please contact the referred provider directly to check on the status.  Recommended Screenings:  Health Maintenance  Topic Date Due   Eye exam for diabetics  Never done   Hepatitis C Screening  Never done   Pneumococcal Vaccine for age over 1 (1 of 2 - PCV) Never done   Colon Cancer Screening  Never done   Zoster (Shingles) Vaccine (1 of 2) Never done   Yearly kidney health urinalysis for diabetes  12/24/2018   Complete foot exam   03/10/2019   Breast Cancer Screening  12/10/2020   Osteoporosis screening with Bone Density Scan  Never done   Hemoglobin A1C  09/30/2023   Yearly kidney function blood test for diabetes  12/10/2023   COVID-19 Vaccine (1 - 2025-26 season) Never done   Flu Shot  09/26/2024*   Medicare Annual Wellness Visit  07/03/2025   DTaP/Tdap/Td vaccine (2 - Td or Tdap) 06/30/2027   Meningitis B Vaccine  Aged Out  *Topic was postponed. The date shown is not the original due date.       07/03/2024    1:56 PM  Advanced Directives  Does Patient Have a Medical Advance Directive? No  Would patient like information on creating a medical advance directive? No - Patient declined    Vision: Annual vision screenings are recommended for early detection of glaucoma,  cataracts, and diabetic retinopathy. These exams can also reveal signs of chronic conditions such as diabetes and high blood pressure.  Dental: Annual dental screenings help detect early signs of oral cancer, gum disease, and other conditions linked to overall health, including heart disease and diabetes.

## 2024-07-03 NOTE — Telephone Encounter (Signed)
 requesting a return phonecall from nurse - pharmacy has not received refill approval on med

## 2024-07-11 ENCOUNTER — Other Ambulatory Visit: Payer: Self-pay | Admitting: *Deleted

## 2024-07-11 DIAGNOSIS — E119 Type 2 diabetes mellitus without complications: Secondary | ICD-10-CM

## 2024-07-31 ENCOUNTER — Telehealth: Payer: Self-pay

## 2024-07-31 ENCOUNTER — Ambulatory Visit: Admitting: Family

## 2024-07-31 DIAGNOSIS — F330 Major depressive disorder, recurrent, mild: Secondary | ICD-10-CM

## 2024-07-31 DIAGNOSIS — K760 Fatty (change of) liver, not elsewhere classified: Secondary | ICD-10-CM | POA: Diagnosis not present

## 2024-07-31 DIAGNOSIS — Z794 Long term (current) use of insulin: Secondary | ICD-10-CM

## 2024-07-31 DIAGNOSIS — E785 Hyperlipidemia, unspecified: Secondary | ICD-10-CM

## 2024-07-31 DIAGNOSIS — E559 Vitamin D deficiency, unspecified: Secondary | ICD-10-CM | POA: Diagnosis not present

## 2024-07-31 DIAGNOSIS — E1169 Type 2 diabetes mellitus with other specified complication: Secondary | ICD-10-CM | POA: Diagnosis not present

## 2024-07-31 DIAGNOSIS — F418 Other specified anxiety disorders: Secondary | ICD-10-CM | POA: Diagnosis not present

## 2024-07-31 DIAGNOSIS — E039 Hypothyroidism, unspecified: Secondary | ICD-10-CM | POA: Diagnosis not present

## 2024-07-31 DIAGNOSIS — E119 Type 2 diabetes mellitus without complications: Secondary | ICD-10-CM

## 2024-07-31 MED ORDER — LEVOTHYROXINE SODIUM 150 MCG PO TABS: 150.0000 ug | ORAL_TABLET | Freq: Every day | ORAL | 1 refills | Status: AC

## 2024-07-31 MED ORDER — ESCITALOPRAM OXALATE 20 MG PO TABS: 20.0000 mg | ORAL_TABLET | Freq: Every day | ORAL | 3 refills | Status: AC

## 2024-07-31 MED ORDER — METFORMIN HCL 500 MG PO TABS: 500.0000 mg | ORAL_TABLET | Freq: Two times a day (BID) | ORAL | 1 refills | Status: AC

## 2024-07-31 MED ORDER — SITAGLIPTIN PHOSPHATE 25 MG PO TABS: 25.0000 mg | ORAL_TABLET | Freq: Every day | ORAL | 1 refills | Status: AC

## 2024-07-31 NOTE — Telephone Encounter (Signed)
 Copied from CRM [id number]. Topic: Clinical - Request for Lab/Test Order >> Jul 31, 2024 12:17 PM Brittany Wood wrote: Reason for CRM: Patient called in stating Dr. Corwin advised her to follow up in 3wks for labs, but no documentation or orders placed in order for me to schedule.

## 2024-07-31 NOTE — Telephone Encounter (Signed)
 I am not sure why it was said lab orders are not in, but I saw her for virtual today and advised her to call and schedule lab only appt probably in the next few weeks due to weather.   Also she needs to schedule a f/u 6 month CPE

## 2025-07-06 ENCOUNTER — Ambulatory Visit
